# Patient Record
Sex: Male | Born: 1950 | Race: White | Hispanic: No | Marital: Married | Smoking: Former smoker
Health system: Southern US, Community
[De-identification: ages and names within clinical notes are randomized; demographics above are authoritative.]

## PROBLEM LIST (undated history)

## (undated) DIAGNOSIS — K219 Gastro-esophageal reflux disease without esophagitis: Secondary | ICD-10-CM

## (undated) DIAGNOSIS — I10 Essential (primary) hypertension: Secondary | ICD-10-CM

## (undated) DIAGNOSIS — Z800 Family history of malignant neoplasm of digestive organs: Secondary | ICD-10-CM

## (undated) DIAGNOSIS — E119 Type 2 diabetes mellitus without complications: Secondary | ICD-10-CM

## (undated) DIAGNOSIS — K222 Esophageal obstruction: Secondary | ICD-10-CM

## (undated) DIAGNOSIS — G8929 Other chronic pain: Secondary | ICD-10-CM

## (undated) DIAGNOSIS — N400 Enlarged prostate without lower urinary tract symptoms: Secondary | ICD-10-CM

## (undated) DIAGNOSIS — Z974 Presence of external hearing-aid: Secondary | ICD-10-CM

## (undated) DIAGNOSIS — E7800 Pure hypercholesterolemia, unspecified: Secondary | ICD-10-CM

## (undated) HISTORY — PX: REPLACEMENT TOTAL KNEE: SUR1224

## (undated) HISTORY — PX: ESOPHAGOGASTRODUODENOSCOPY (EGD) WITH ESOPHAGEAL DILATION: SHX5812

## (undated) HISTORY — PX: CERVICAL FUSION: SHX112

## (undated) HISTORY — PX: COLONOSCOPY W/ BIOPSIES AND POLYPECTOMY: SHX1376

---

## 2005-03-08 ENCOUNTER — Ambulatory Visit: Payer: Self-pay | Admitting: Gastroenterology

## 2005-12-13 ENCOUNTER — Ambulatory Visit: Payer: Self-pay | Admitting: Ophthalmology

## 2006-02-13 ENCOUNTER — Other Ambulatory Visit: Payer: Self-pay

## 2006-02-13 ENCOUNTER — Emergency Department: Payer: Self-pay | Admitting: Emergency Medicine

## 2006-02-14 ENCOUNTER — Ambulatory Visit: Payer: Self-pay | Admitting: Internal Medicine

## 2006-03-02 ENCOUNTER — Ambulatory Visit: Payer: Self-pay | Admitting: Internal Medicine

## 2007-06-04 ENCOUNTER — Ambulatory Visit: Payer: Self-pay | Admitting: Gastroenterology

## 2007-08-20 ENCOUNTER — Ambulatory Visit: Payer: Self-pay | Admitting: Internal Medicine

## 2008-03-08 IMAGING — CT CT CERVICAL SPINE WITHOUT CONTRAST
1 series · 12 of 14 positions shown, 15 images · non-contrast
Comparison: none

REASON FOR EXAM: Fell from roof  /pain  rm 9
COMMENTS:

[Series 5: axial · axial · 0.20mm/px · z∈[-137,+6]mm · 12 of 87 slices shown, 15 images]
[im 7/87  soft-tissue]
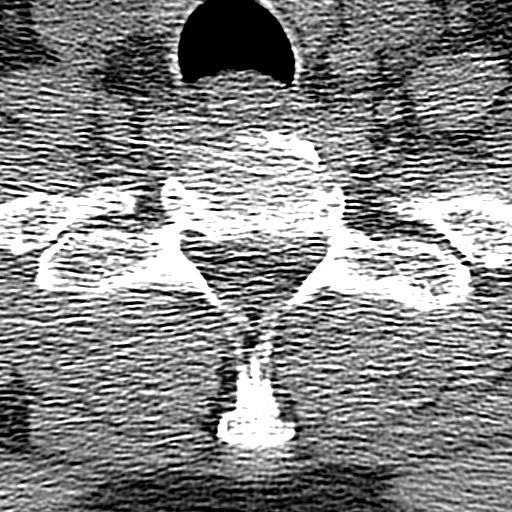
[im 7/87  bone]
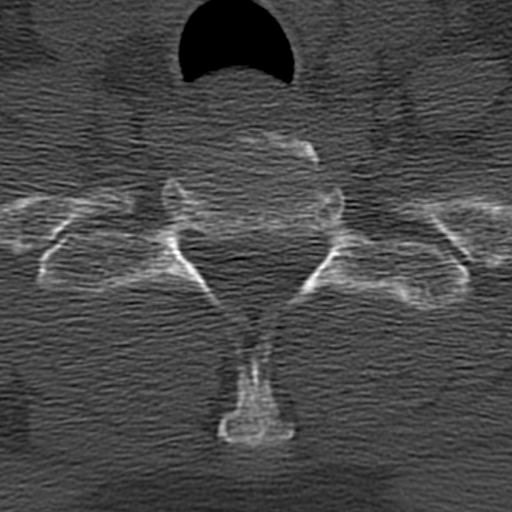
[im 14/87  bone]
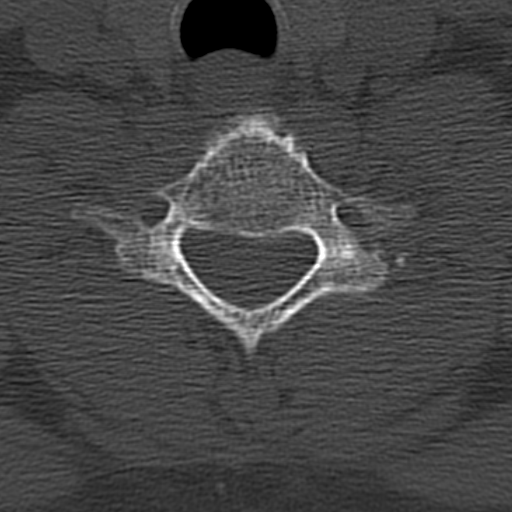
[im 20/87  bone]
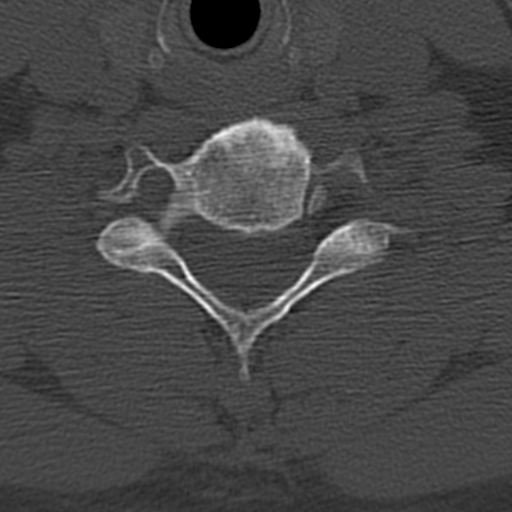
[im 27/87  bone]
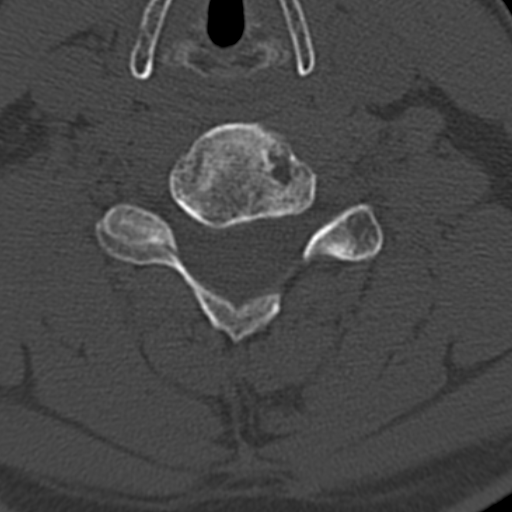
[im 34/87  soft-tissue]
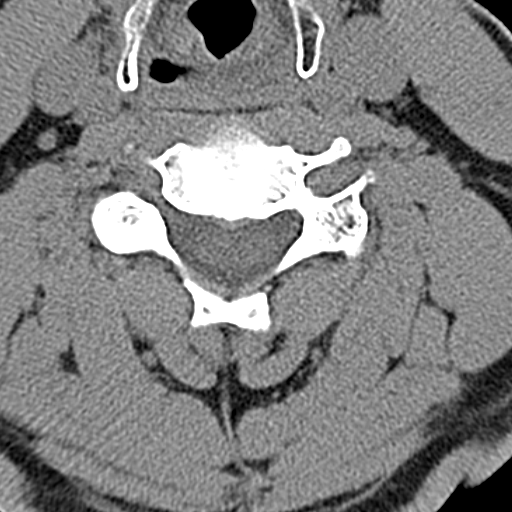
[im 34/87  bone]
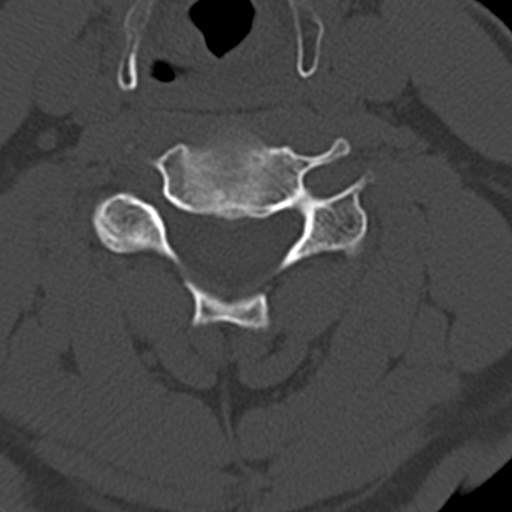
[im 40/87  bone]
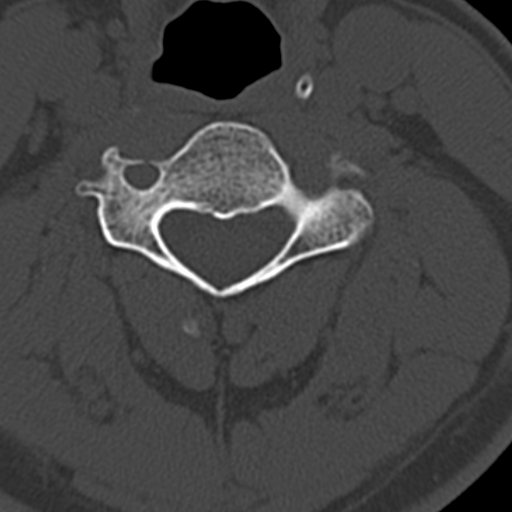
[im 47/87  bone]
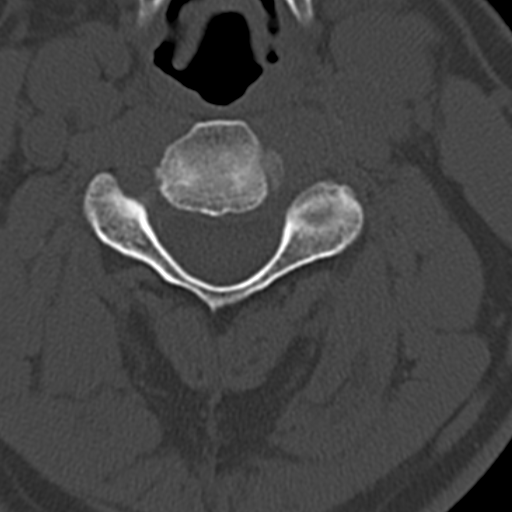
[im 53/87  bone]
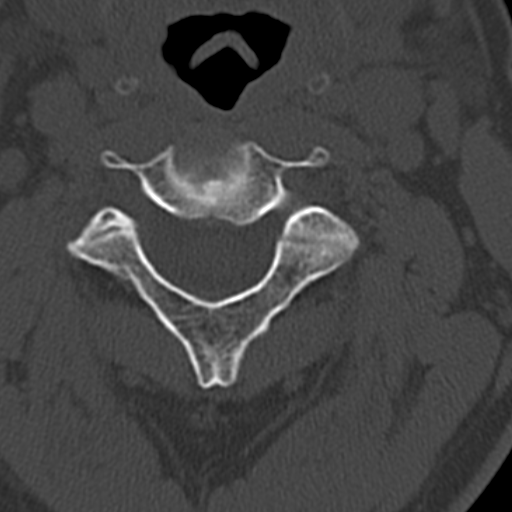
[im 60/87  soft-tissue]
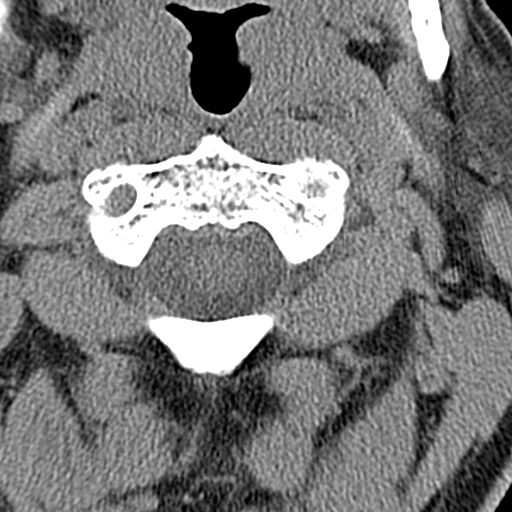
[im 60/87  bone]
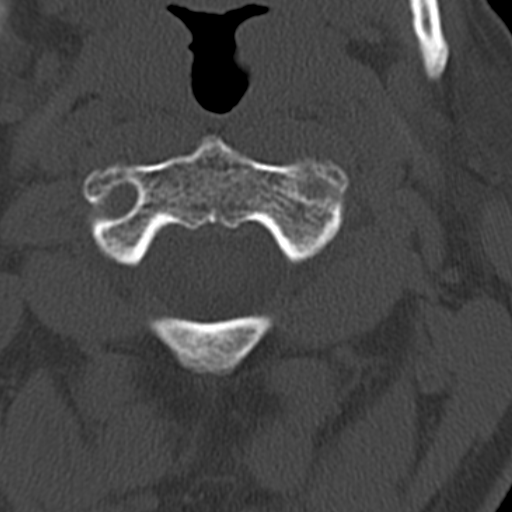
[im 67/87  bone]
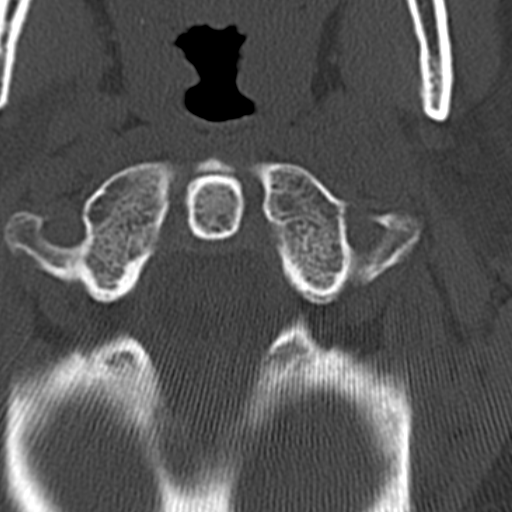
[im 73/87  bone]
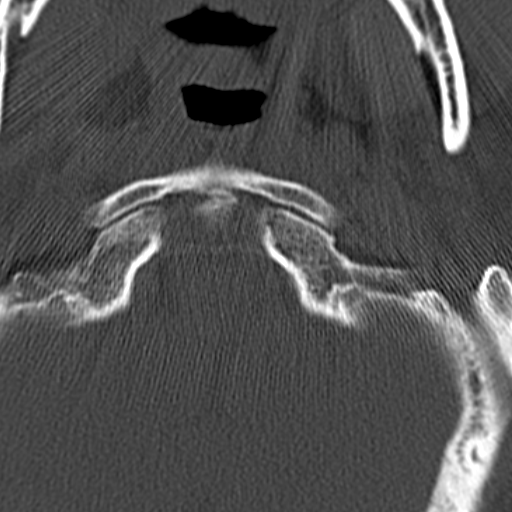
[im 80/87  bone]
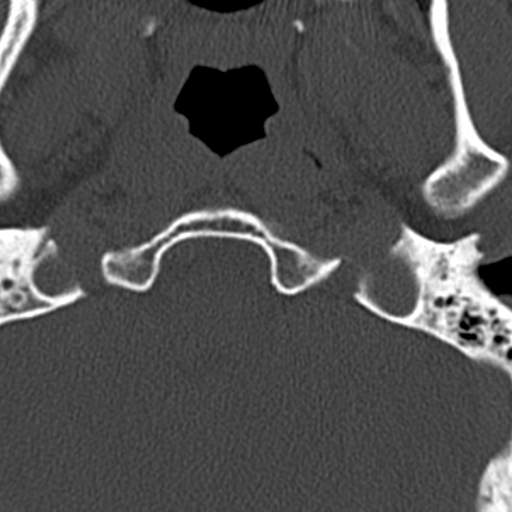

[12 of 14 positions shown; findings below may reference images not displayed]

PROCEDURE:     CT  - CT CERVICAL SPINE WO  - February 13, 2006  [DATE]

RESULT:     The patient sustained injury in a fall.

The cervical vertebral bodies are preserved in height.  There is congenital
fusion across the C5-C6 disc space. The prevertebral soft tissue spaces are
normal. The atlantodens interval is normal in appearance. The lateral masses
of C1 align normally with those of C2. The bony rings at each cervical level
are intact. There is no evidence of a jumped facet. No bony spinal stenosis
is evident.
IMPRESSION: 1. I do not see evidence of acute cervical spine fracture. There is no
evidence of dislocation or of a jumped facet either.
2. There is congenital fusion across the C5-C6 disc space. There is an
endplate spur centrally and to the right at this level but it does not
appear to the producing high-grade spinal stenosis.

The findings were called to the [HOSPITAL] the conclusion of
the study.

## 2008-03-08 IMAGING — CR DG LUMBAR SPINE AP/LAT/OBLIQUES W/ FLEX AND EXT
1 series · 5 of 5 positions shown · non-contrast
Comparison: none

REASON FOR EXAM: Fell from roof  /pain
COMMENTS:

[Series 1: view not recorded · 0.17mm/px · 5 of 5 slices shown]
[im 1/5]
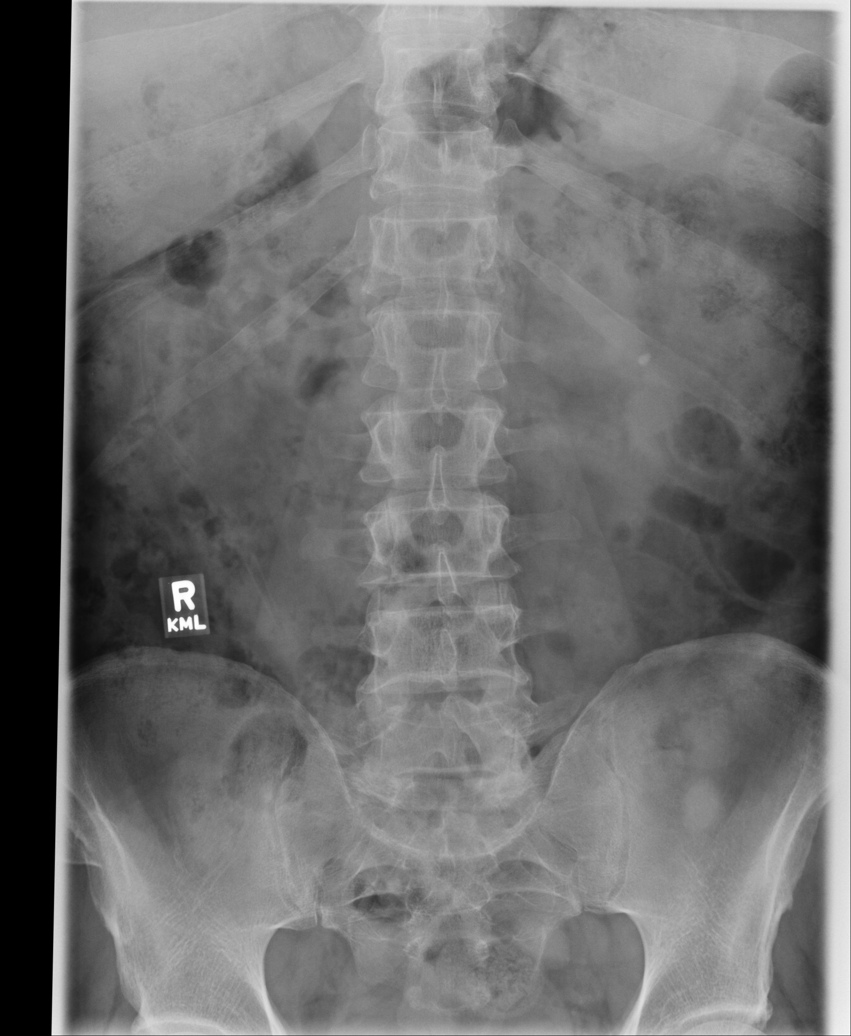
[im 2/5]
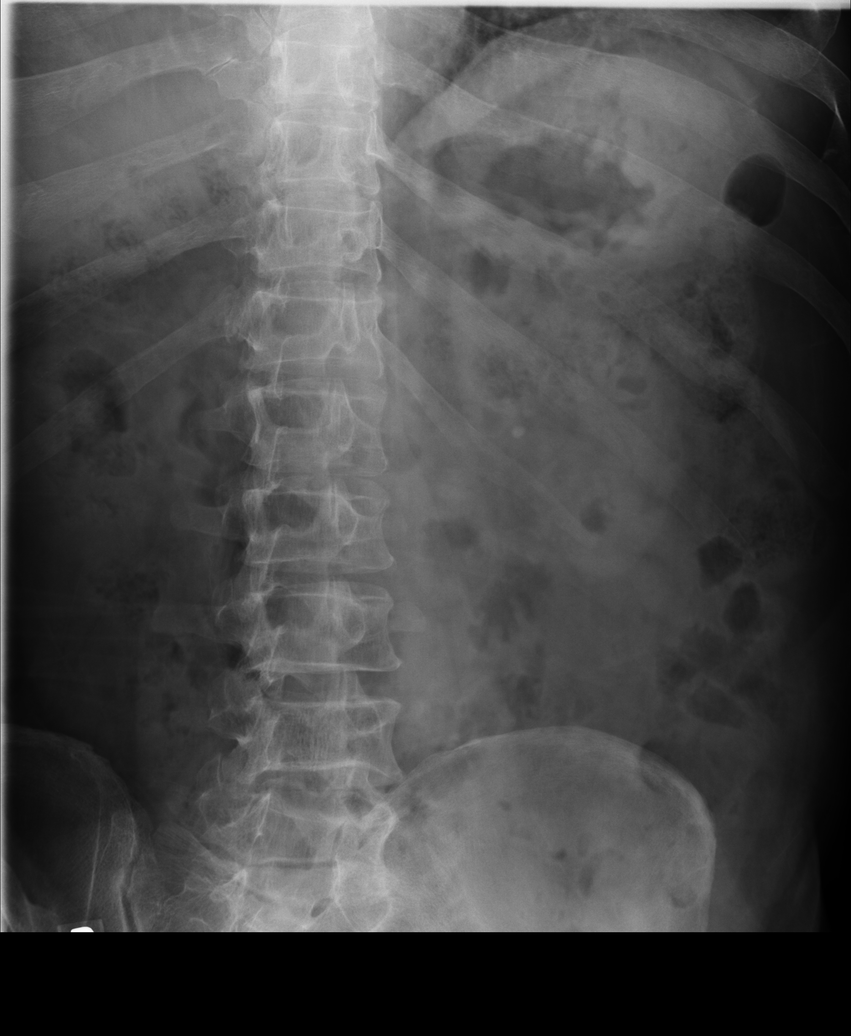
[im 3/5]
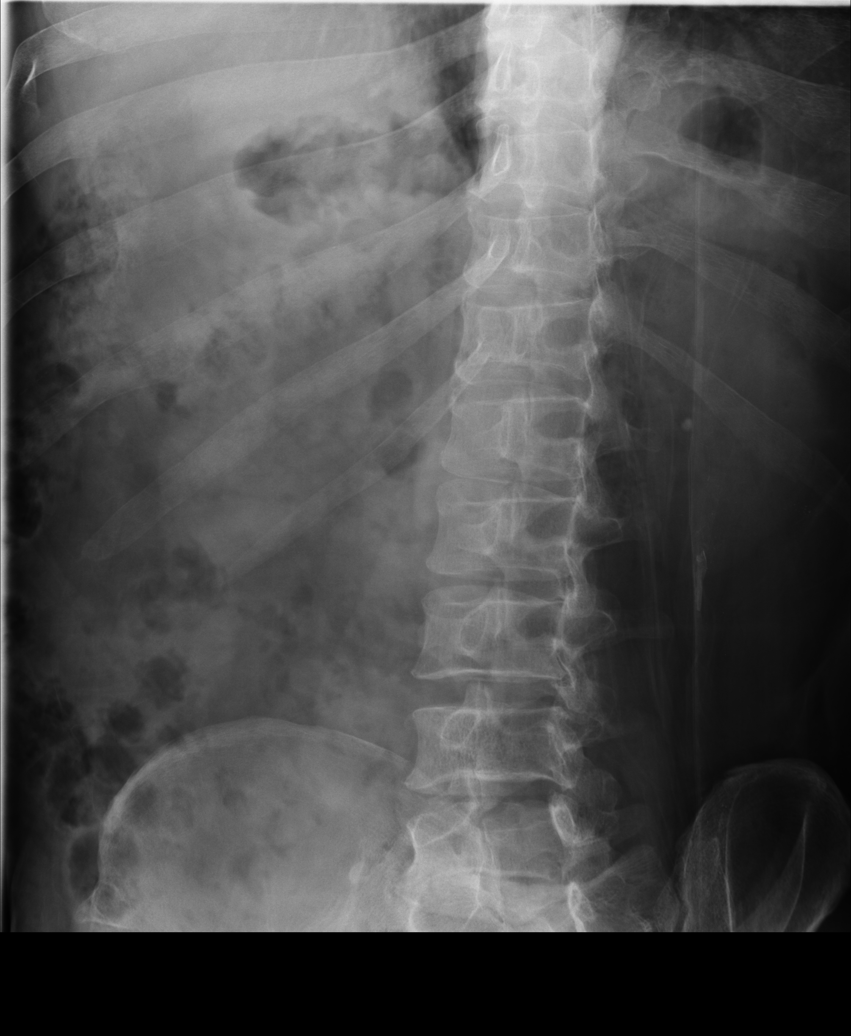
[im 4/5]
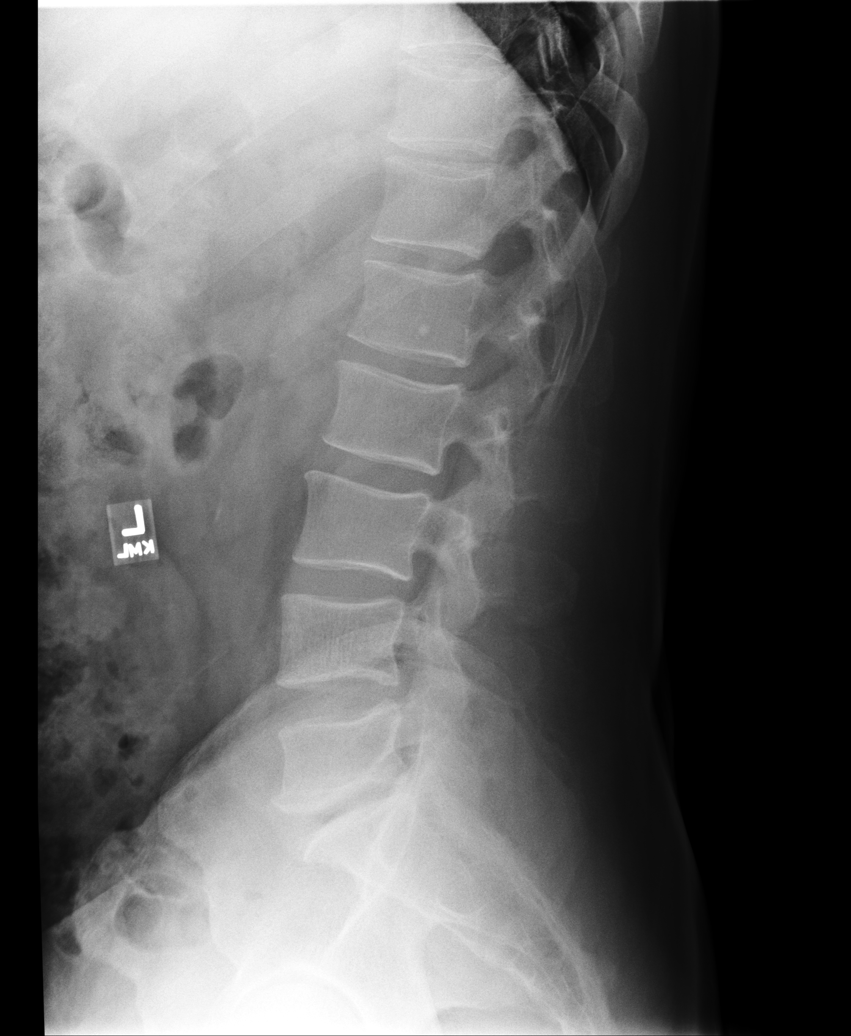
[im 5/5]
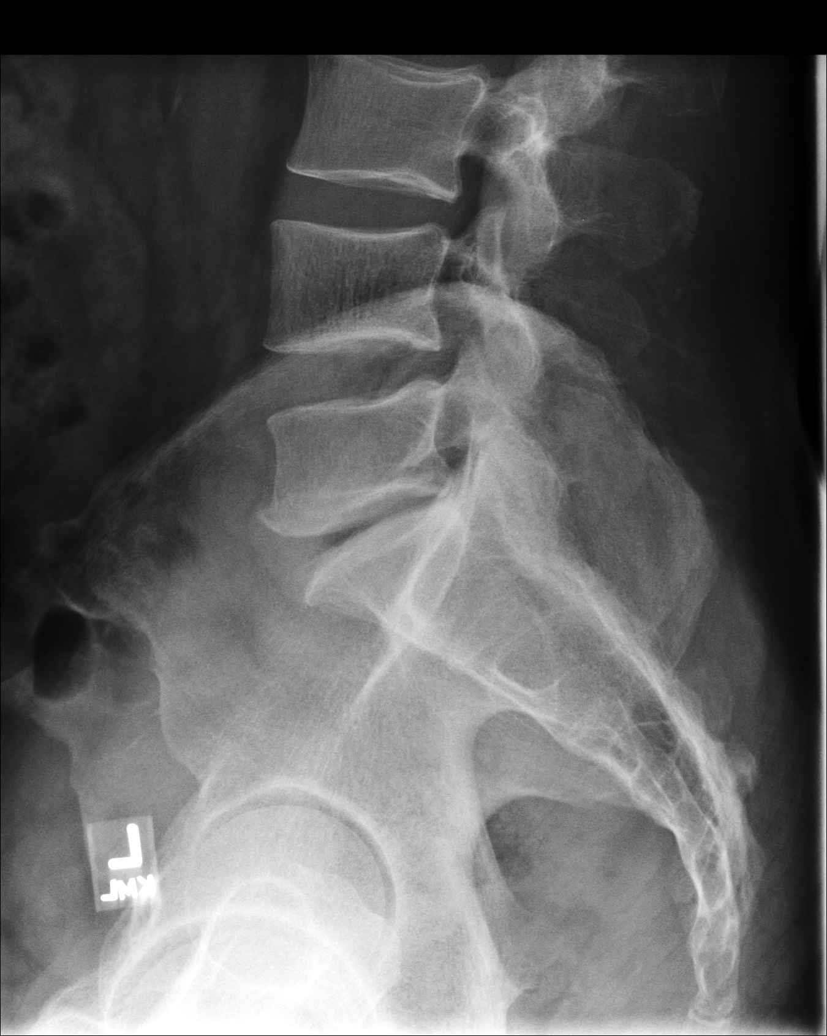

[5 of 5 positions shown; findings below may reference images not displayed]

PROCEDURE:     DXR - DXR LUMBAR SPINE WITH OBLIQUES  - February 13, 2006  [DATE]

RESULT:       Views of the lumbar spine show a rounded calcific density
superimposed over the T12, LEFT rib which may represent a renal stone.  The
vertebral body heights and intervertebral disc spaces appear to be within
normal limits.  The facets are normally aligned.  There appears to be a pars
defect on the RIGHT at L5.  There is disc space narrowing at L5-S1.  Pars
region on the LEFT at L5 appears to be intact.  Subtle defect may be
difficult to see.  CT could be performed for further evaluation if desired.
There is no subluxation.
IMPRESSION: 1.     Probable pars defect on the right at L5.
2.     Intervertebral disc space narrowing at L5-S1.

## 2008-03-08 IMAGING — CR DG CHEST 2V
1 series · 2 of 2 positions shown · non-contrast
Comparison: none

REASON FOR EXAM: fell  from roof  /pain
COMMENTS:

PROCEDURE:     DXR - DXR CHEST PA (OR AP) AND LATERAL  - February 13, 2006  [DATE]
RESULT:     The lungs are mildly hyperinflated but clear. The heart and
pulmonary vascularity are within the limits of normal. There is no pleural
effusion. I see no acute bony abnormality.

[Series 1: view not recorded · 0.17mm/px · 2 of 2 slices shown]
[im 1/2]
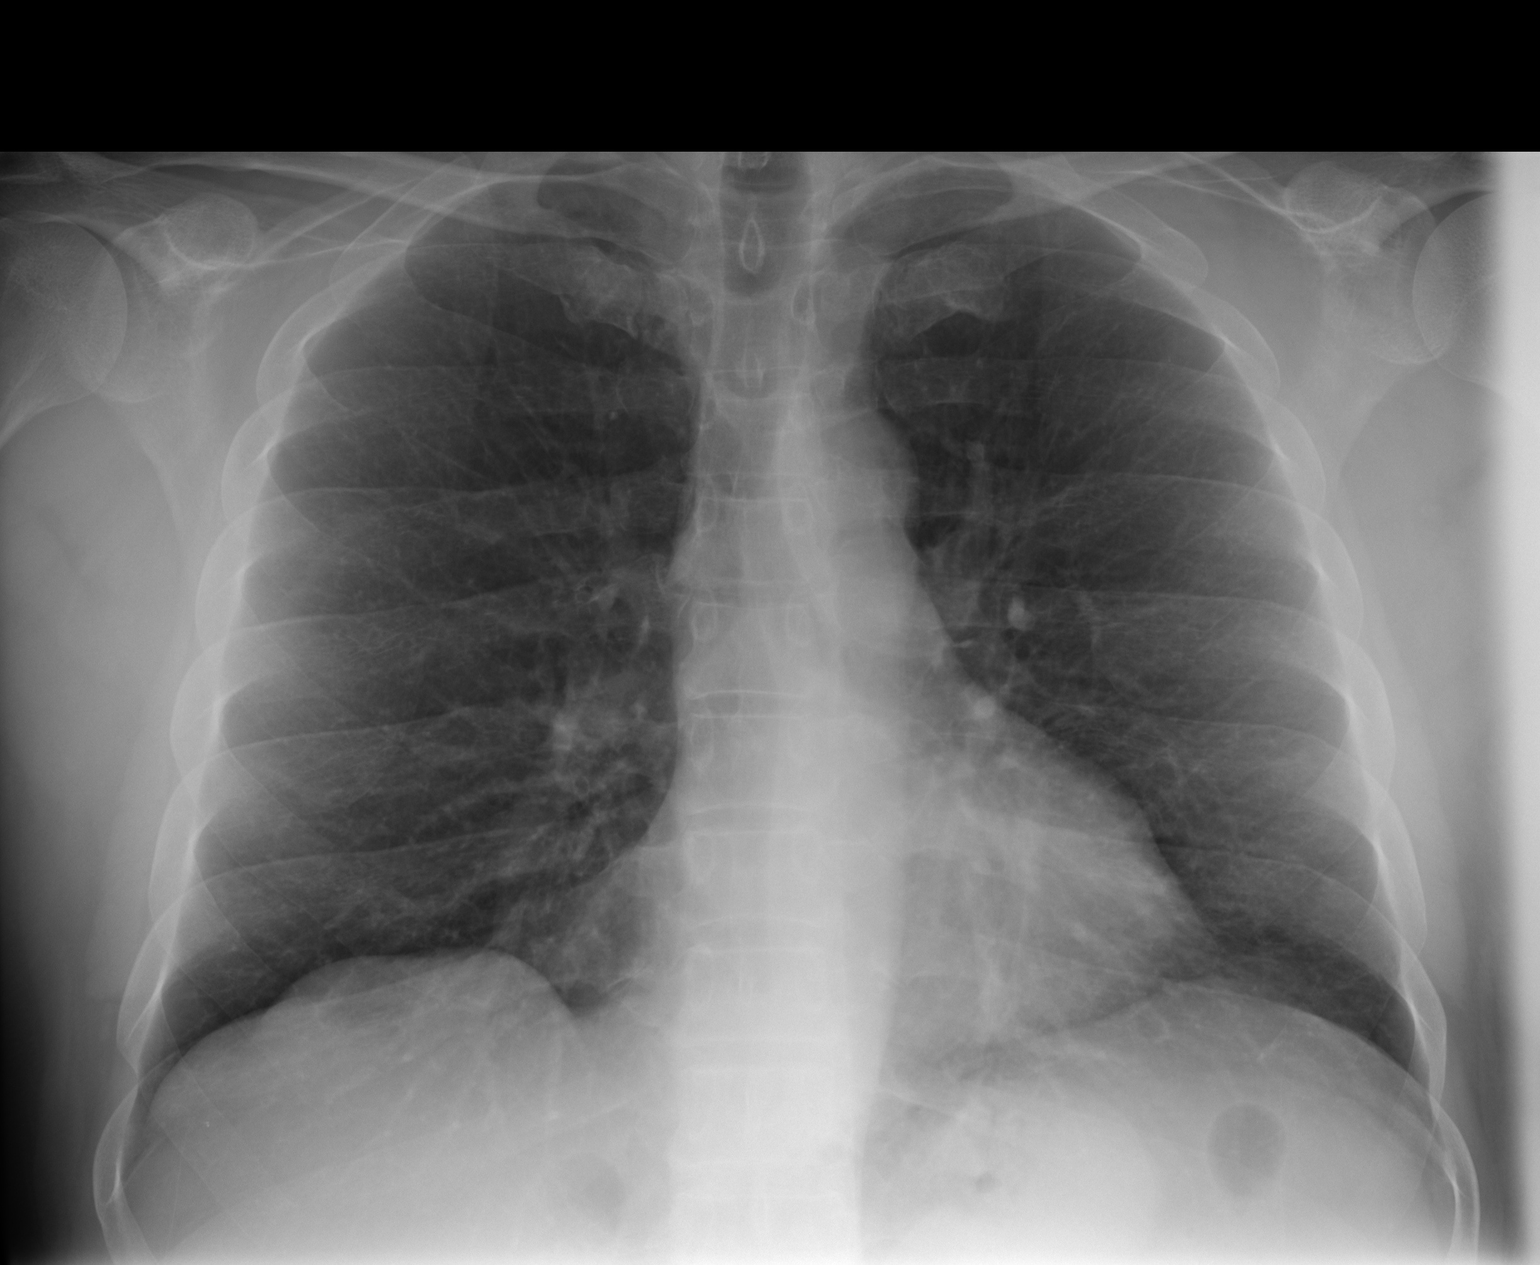
[im 2/2]
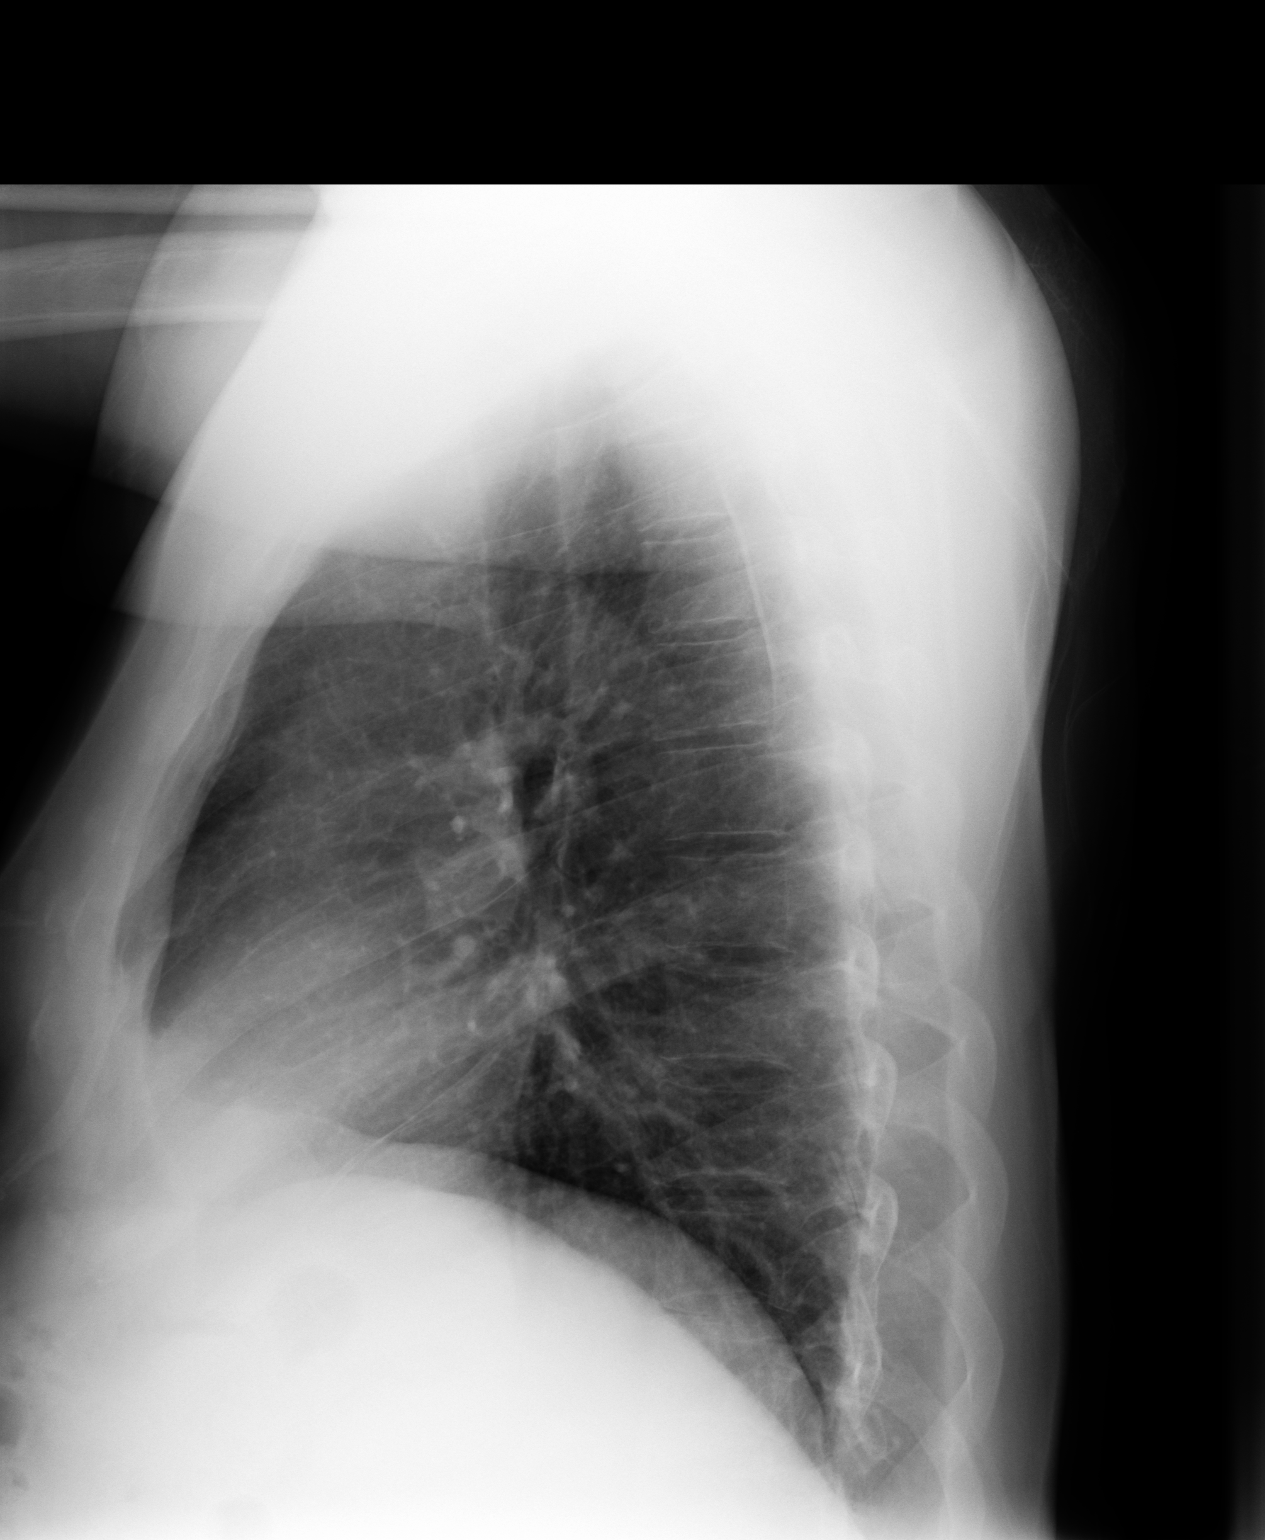

[2 of 2 positions shown; findings below may reference images not displayed]

IMPRESSION: I do not see evidence of a pneumothorax nor definite
evidence of acute rib fracture. If there remain clinical concerns of
thoracic trauma, further evaluation with CT scanning is available for
request.

## 2008-03-09 IMAGING — CR DG ABDOMEN 1V
1 series · 1 of 1 positions shown · non-contrast
Comparison: none

REASON FOR EXAM: Left flank pain
COMMENTS:

[view not recorded]
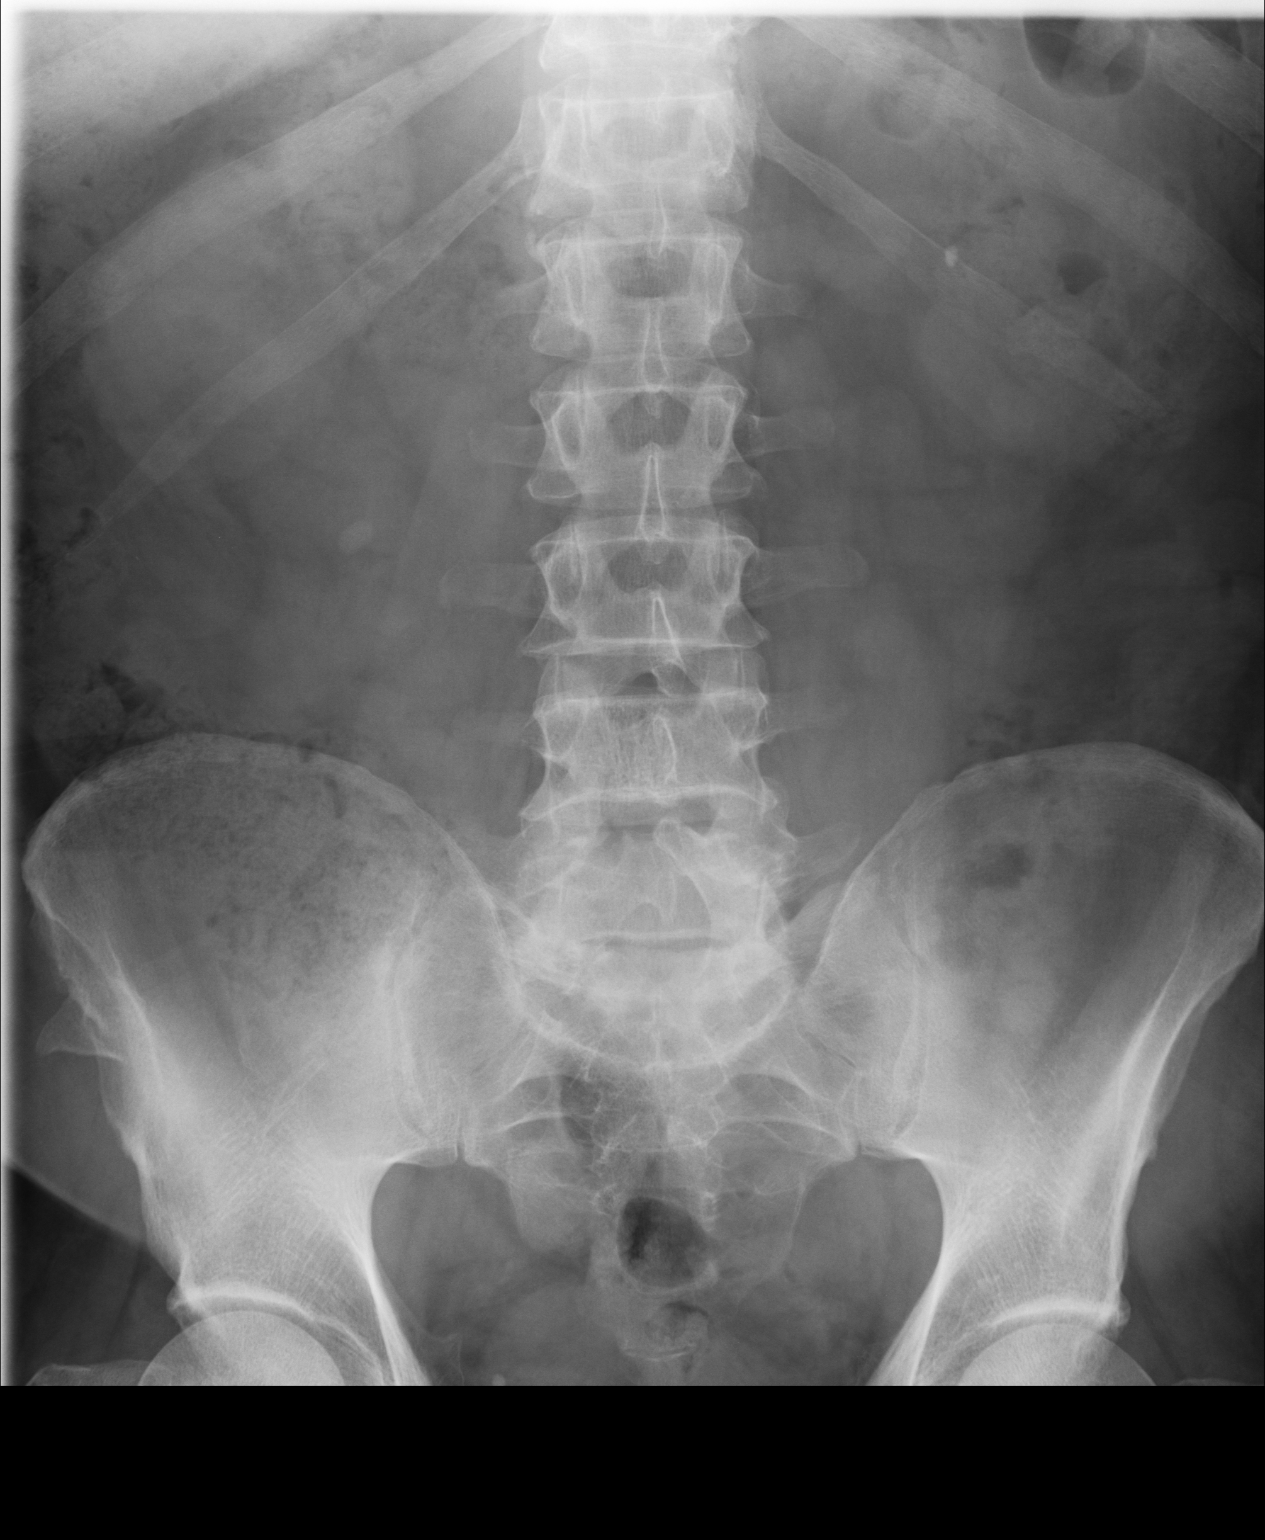

[1 of 1 positions shown; findings below may reference images not displayed]

PROCEDURE:     DXR - DXR KIDNEY URETER BLADDER  - February 14, 2006 [DATE]

RESULT:

The bowel gas pattern is unremarkable. There is a rounded calcification
projecting over the renal pelvis on the left which measures approximately 6
mm in greatest dimension. There is also a faint calcification that projects
inferior to the lower pole of the right kidney which measures as much is 11
mm in greatest dimension. There is a probable phlebolith in the right aspect
and pelvis. I see no acute bony abnormality.
IMPRESSION: 1. There is a calcification that projects over the region of the renal
pelvis on the left that likely reflects a stone.
2. I cannot exclude a stone in the region the proximal right ureter.
Further value evaluation with CT scanning or an IVP may be of value.
3. I see no bowel abnormality.

## 2010-04-04 ENCOUNTER — Ambulatory Visit: Payer: Self-pay | Admitting: Specialist

## 2010-04-15 ENCOUNTER — Ambulatory Visit: Payer: Self-pay | Admitting: Specialist

## 2010-04-22 ENCOUNTER — Ambulatory Visit: Payer: Self-pay | Admitting: Specialist

## 2010-10-24 ENCOUNTER — Ambulatory Visit: Payer: Self-pay | Admitting: Specialist

## 2010-11-01 ENCOUNTER — Inpatient Hospital Stay: Payer: Self-pay | Admitting: Specialist

## 2014-01-07 ENCOUNTER — Ambulatory Visit: Payer: Self-pay | Admitting: Gastroenterology

## 2014-04-27 LAB — SURGICAL PATHOLOGY

## 2016-09-27 ENCOUNTER — Encounter: Payer: Self-pay | Admitting: Emergency Medicine

## 2016-09-27 ENCOUNTER — Emergency Department
Admission: EM | Admit: 2016-09-27 | Discharge: 2016-09-27 | Disposition: A | Discharge status: Home / Self Care | Payer: No Typology Code available for payment source | Attending: Emergency Medicine | Admitting: Emergency Medicine

## 2016-09-27 ENCOUNTER — Emergency Department: Payer: No Typology Code available for payment source

## 2016-09-27 DIAGNOSIS — Y9389 Activity, other specified: Secondary | ICD-10-CM | POA: Diagnosis not present

## 2016-09-27 DIAGNOSIS — I10 Essential (primary) hypertension: Secondary | ICD-10-CM | POA: Insufficient documentation

## 2016-09-27 DIAGNOSIS — S161XXA Strain of muscle, fascia and tendon at neck level, initial encounter: Secondary | ICD-10-CM | POA: Diagnosis not present

## 2016-09-27 DIAGNOSIS — Z96651 Presence of right artificial knee joint: Secondary | ICD-10-CM | POA: Diagnosis not present

## 2016-09-27 DIAGNOSIS — Y92410 Unspecified street and highway as the place of occurrence of the external cause: Secondary | ICD-10-CM | POA: Diagnosis not present

## 2016-09-27 DIAGNOSIS — Y999 Unspecified external cause status: Secondary | ICD-10-CM | POA: Insufficient documentation

## 2016-09-27 DIAGNOSIS — Z87891 Personal history of nicotine dependence: Secondary | ICD-10-CM | POA: Insufficient documentation

## 2016-09-27 DIAGNOSIS — S199XXA Unspecified injury of neck, initial encounter: Secondary | ICD-10-CM | POA: Diagnosis present

## 2016-09-27 HISTORY — DX: Pure hypercholesterolemia, unspecified: E78.00

## 2016-09-27 HISTORY — DX: Essential (primary) hypertension: I10

## 2016-09-27 MED ORDER — ORPHENADRINE CITRATE 30 MG/ML IJ SOLN
60.0000 mg | Freq: Once | INTRAMUSCULAR | Status: AC
  Administered 2016-09-27: 60 mg via INTRAMUSCULAR
  Filled 2016-09-27: qty 2

## 2016-09-27 MED ORDER — KETOROLAC TROMETHAMINE 30 MG/ML IJ SOLN
30.0000 mg | Freq: Once | INTRAMUSCULAR | Status: AC
  Administered 2016-09-27: 30 mg via INTRAMUSCULAR
  Filled 2016-09-27: qty 1

## 2016-09-27 MED ORDER — METHOCARBAMOL 750 MG PO TABS: 750.0000 mg | ORAL_TABLET | Freq: Four times a day (QID) | ORAL | 0 refills | Status: DC

## 2016-09-27 MED ORDER — MELOXICAM 15 MG PO TABS: 15.0000 mg | ORAL_TABLET | Freq: Every day | ORAL | 0 refills | Status: DC
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
## 2021-10-13 ENCOUNTER — Other Ambulatory Visit: Payer: Self-pay

## 2021-10-13 MED ORDER — CIPROFLOXACIN-DEXAMETHASONE 0.3-0.1 % OT SUSP
OTIC | 0 refills | Status: DC
  Filled 2021-10-13: qty 7.5, 10d supply, fill #0

## 2021-10-14 ENCOUNTER — Other Ambulatory Visit: Payer: Self-pay

## 2021-10-14 ENCOUNTER — Encounter: Payer: Self-pay | Admitting: Unknown Physician Specialty

## 2021-10-17 ENCOUNTER — Encounter: Payer: Self-pay | Admitting: Unknown Physician Specialty

## 2021-10-17 ENCOUNTER — Other Ambulatory Visit: Payer: Self-pay

## 2021-10-17 NOTE — Discharge Instructions (Signed)
[facility] DISCHARGE INSTRUCTIONS FOR MYRINGOTOMY AND TUBE INSERTION  [facility] CHAPMAN T. MCQUEEN, M.D.   Diet:   After surgery, the patient should take only liquids and foods as tolerated.  The patient may then have a regular diet after the effects of anesthesia have worn off, usually about four to six hours after surgery.  Activities:   The patient should rest until the effects of anesthesia have worn off.  After this, there are no restrictions on the normal daily activities.  Medications:   You will be given a prescription for antibiotic drops to be used in the ears postoperatively.  It is recommended to use 4 drops 2 times a day for 7 days, then the drops should be saved for possible future use.  The tubes should not cause any discomfort to the patient, but if there is any question, Tylenol should be given according to the instructions for the age of the patient.  Other medications should be continued normally.  Precautions:   Should there be recurrent drainage after the tubes are placed, the drops should be used for approximately 3-4 days.  If it does not clear, you should call the ENT office.  Earplugs:   Earplugs are only needed for those who are going to be submerged under water.  When taking a bath or shower and using a cup or showerhead to rinse hair, it is not necessary to wear earplugs.  These come in a variety of fashions, all of which can be obtained at our office.  However, if one is not able to come by the office, then silicone plugs can be found at most pharmacies.  It is not advised to stick anything in the ear that is not approved as an earplug.  Silly putty is not to be used as an earplug.  Swimming is allowed in patients after ear tubes are inserted, however, they must wear earplugs if they are going to be submerged under water.  For those children who are going to be swimming a lot, it is recommended to use a fitted ear mold, which can be  made by our audiologist.  If discharge is noticed from the ears, this most likely represents an ear infection.  We would recommend getting your eardrops and using them as indicated above.  If it does not clear, then you should call the ENT office.  For follow up, the patient should return to the ENT office three weeks postoperatively and then every six months as required by the doctor. 

## 2021-10-20 NOTE — Anesthesia Preprocedure Evaluation (Addendum)
Anesthesia Evaluation  Patient identified by MRN, date of birth, ID band Patient awake    Reviewed: Allergy & Precautions, NPO status , Patient's Chart, lab work & pertinent test results  Airway Mallampati: III  TM Distance: >3 FB Neck ROM: full    Dental  (+) Chipped   Pulmonary neg pulmonary ROS, former smoker,    Pulmonary exam normal        Cardiovascular hypertension, Normal cardiovascular exam     Neuro/Psych negative neurological ROS  negative psych ROS   GI/Hepatic Neg liver ROS, GERD  Medicated and Controlled,  Endo/Other  diabetes  Renal/GU negative Renal ROS  negative genitourinary   Musculoskeletal   Abdominal Normal abdominal exam  (+)   Peds  Hematology negative hematology ROS (+)   Anesthesia Other Findings Past Medical History: No date: BPH (benign prostatic hyperplasia) No date: Chronic pain of right knee No date: DM II (diabetes mellitus, type II), controlled ([place]) No date: Esophageal stricture No date: Family history of colon cancer     Comment:  sister (in her 15s) No date: GERD (gastroesophageal reflux disease) No date: Hypercholesteremia No date: Hypertension No date: Wears hearing aid in both ears  Past Surgical History: No date: CERVICAL FUSION     Comment:  c5-c6 No date: COLONOSCOPY W/ BIOPSIES AND POLYPECTOMY No date: ESOPHAGOGASTRODUODENOSCOPY (EGD) WITH ESOPHAGEAL DILATION No date: REPLACEMENT TOTAL KNEE; Right No date: REPLACEMENT TOTAL KNEE; Right  BMI    Body Mass Index: 30.13 kg/m      Reproductive/Obstetrics negative OB ROS                            Anesthesia Physical Anesthesia Plan  ASA: 2  Anesthesia Plan: General   Post-op Pain Management: Minimal or no pain anticipated   Induction: Intravenous  PONV Risk Score and Plan: 2 and Ondansetron and Treatment may vary due to age or medical condition  Airway Management Planned:  Natural Airway  Additional Equipment:   Intra-op Plan:   Post-operative Plan:   Informed Consent: I have reviewed the patients History and Physical, chart, labs and discussed the procedure including the risks, benefits and alternatives for the proposed anesthesia with the patient or authorized representative who has indicated his/her understanding and acceptance.     Dental Advisory Given  Plan Discussed with: Anesthesiologist, CRNA and Surgeon  Anesthesia Plan Comments:        Anesthesia Quick Evaluation

## 2021-10-21 ENCOUNTER — Ambulatory Visit
Admission: RE | Admit: 2021-10-21 | Discharge: 2021-10-21 | Disposition: A | Discharge status: Home / Self Care | Payer: Medicare HMO | Attending: Unknown Physician Specialty | Admitting: Unknown Physician Specialty

## 2021-10-21 ENCOUNTER — Ambulatory Visit
Admission: RE | Disposition: A | Discharge status: Home / Self Care | Payer: Self-pay | Source: Home / Self Care | Attending: Unknown Physician Specialty

## 2021-10-21 ENCOUNTER — Ambulatory Visit: Payer: Medicare HMO | Admitting: Anesthesiology

## 2021-10-21 ENCOUNTER — Other Ambulatory Visit: Payer: Self-pay

## 2021-10-21 ENCOUNTER — Encounter: Payer: Self-pay | Admitting: Unknown Physician Specialty

## 2021-10-21 DIAGNOSIS — K219 Gastro-esophageal reflux disease without esophagitis: Secondary | ICD-10-CM | POA: Diagnosis not present

## 2021-10-21 DIAGNOSIS — I10 Essential (primary) hypertension: Secondary | ICD-10-CM | POA: Insufficient documentation

## 2021-10-21 DIAGNOSIS — Z87891 Personal history of nicotine dependence: Secondary | ICD-10-CM | POA: Insufficient documentation

## 2021-10-21 DIAGNOSIS — Z7984 Long term (current) use of oral hypoglycemic drugs: Secondary | ICD-10-CM | POA: Diagnosis not present

## 2021-10-21 DIAGNOSIS — H9072 Mixed conductive and sensorineural hearing loss, unilateral, left ear, with unrestricted hearing on the contralateral side: Secondary | ICD-10-CM | POA: Diagnosis not present

## 2021-10-21 DIAGNOSIS — Z79899 Other long term (current) drug therapy: Secondary | ICD-10-CM | POA: Diagnosis not present

## 2021-10-21 DIAGNOSIS — H6522 Chronic serous otitis media, left ear: Secondary | ICD-10-CM | POA: Insufficient documentation

## 2021-10-21 DIAGNOSIS — E119 Type 2 diabetes mellitus without complications: Secondary | ICD-10-CM | POA: Insufficient documentation

## 2021-10-21 DIAGNOSIS — H6992 Unspecified Eustachian tube disorder, left ear: Secondary | ICD-10-CM | POA: Diagnosis present

## 2021-10-21 HISTORY — DX: Presence of external hearing-aid: Z97.4

## 2021-10-21 HISTORY — DX: Family history of malignant neoplasm of digestive organs: Z80.0

## 2021-10-21 HISTORY — DX: Gastro-esophageal reflux disease without esophagitis: K21.9

## 2021-10-21 HISTORY — DX: Type 2 diabetes mellitus without complications: E11.9

## 2021-10-21 HISTORY — DX: Other chronic pain: G89.29

## 2021-10-21 HISTORY — DX: Benign prostatic hyperplasia without lower urinary tract symptoms: N40.0

## 2021-10-21 HISTORY — DX: Esophageal obstruction: K22.2

## 2021-10-21 HISTORY — PX: MYRINGOTOMY WITH TUBE PLACEMENT: SHX5663

## 2021-10-21 LAB — GLUCOSE, CAPILLARY
Glucose-Capillary: 136 mg/dL — ABNORMAL HIGH (ref 70–99)
Glucose-Capillary: 130 mg/dL — ABNORMAL HIGH (ref 70–99)

## 2021-10-21 SURGERY — MYRINGOTOMY WITH TUBE PLACEMENT
Anesthesia: General | Site: Ear | Laterality: Left

## 2021-10-21 MED ORDER — PROPOFOL 500 MG/50ML IV EMUL
INTRAVENOUS | Status: DC | PRN
  Administered 2021-10-21: 140 ug/kg/min via INTRAVENOUS

## 2021-10-21 MED ORDER — LACTATED RINGERS IV SOLN: INTRAVENOUS | Status: DC

## 2021-10-21 MED ORDER — CIPROFLOXACIN-DEXAMETHASONE 0.3-0.1 % OT SUSP
OTIC | Status: DC | PRN
  Administered 2021-10-21: 1 [drp] via OTIC

## 2021-10-21 SURGICAL SUPPLY — 10 items
BALL CTTN LRG ABS STRL LF (GAUZE/BANDAGES/DRESSINGS) ×1
BLADE MYR LANCE NRW W/HDL (BLADE) IMPLANT
CANISTER SUCT 1200ML W/VALVE (MISCELLANEOUS) ×1 IMPLANT
COTTONBALL LRG STERILE PKG (GAUZE/BANDAGES/DRESSINGS) ×1 IMPLANT
GLOVE SURG ENC TEXT LTX SZ7.5 (GLOVE) ×1 IMPLANT
STRAP BODY AND KNEE 60X3 (MISCELLANEOUS) ×1 IMPLANT
TOWEL OR 17X26 4PK STRL BLUE (TOWEL DISPOSABLE) ×1 IMPLANT
TUBE T RICHARD MOD 1.32 ID 4.8 (OTOLOGIC RELATED) IMPLANT
TUBING CONN 6MMX3.1M (TUBING) ×1
TUBING SUCTION CONN 0.25 STRL (TUBING) ×1 IMPLANT

## 2021-10-21 NOTE — H&P (Signed)
The patient's history has been reviewed, patient examined, no change in status, stable for surgery.  Questions were answered to the patients satisfaction.  

## 2021-10-21 NOTE — Op Note (Signed)
10/21/2021  8:32 AM    Ronald Aguilar  [id number]   Pre-Op Dx: Ronald Aguilar Chronic Otitis Media  Post-op Dx: SAME  Proc: Left myringotomy and butterfly tube placement  Surg:  Ronald Aguilar  Anes:  GOT  EBL: 0  Comp: None  Findings: Serous otitis left middle ear  Procedure: Ronald Aguilar was identified in the holding area taken the operating placed in supine position.  After IV sedation the operating microscope was brought in the field.  Lamination the left ear showed left middle ear effusion.  An inferior myringotomy was performed serous fluid was suctioned free.  A butterfly T-tube was placed within the myringotomy followed by Ciprodex.  Patient was then awakened in the operating room taken to cover in stable condition  Dispo:   Good  Plan: Discharged home follow-up 3 weeks  Ronald Aguilar  10/21/2021 8:32 AM

## 2021-10-21 NOTE — Anesthesia Postprocedure Evaluation (Signed)
Anesthesia Post Note  Patient: Ronald Aguilar.  Procedure(s) Performed: MYRINGOTOMY WITH TUBE PLACEMENT WITH BUTTERFLY TUBE (Left: Ear)  Patient location during evaluation: PACU Anesthesia Type: General Level of consciousness: awake and alert Pain management: pain level controlled Vital Signs Assessment: post-procedure vital signs reviewed and stable Respiratory status: spontaneous breathing, nonlabored ventilation and respiratory function stable Cardiovascular status: blood pressure returned to baseline and stable Postop Assessment: no apparent nausea or vomiting Anesthetic complications: no   No notable events documented.   Last Vitals:  Vitals:   10/21/21 0838 10/21/21 0849  BP: 126/72 134/81  Pulse: 72   Resp: (!) 21   Temp: (!) 36.3 C (!) 36.4 C  SpO2: 97%     Last Pain:  Vitals:   10/21/21 0849  TempSrc:   PainSc: [place]

## 2021-10-21 NOTE — Transfer of Care (Signed)
Immediate Anesthesia Transfer of Care Note  Patient: Ronald Aguilar.  Procedure(s) Performed: MYRINGOTOMY WITH TUBE PLACEMENT WITH BUTTERFLY TUBE (Left: Ear)  Patient Location: PACU  Anesthesia Type:General  Level of Consciousness: awake, alert  and oriented  Airway & Oxygen Therapy: Patient Spontanous Breathing and Patient connected to nasal cannula oxygen  Post-op Assessment: Report given to RN and Post -op Vital signs reviewed and stable  Post vital signs: stable  Last Vitals:  Vitals Value Taken Time  BP    Temp    Pulse 72 10/21/21 0838  Resp 21 10/21/21 0838  SpO2 97 % 10/21/21 0838  Vitals shown include unvalidated device data.  Last Pain:  Vitals:   10/21/21 0743  TempSrc: Temporal  PainSc: 0-No pain         Complications: No notable events documented.

## 2021-10-23 ENCOUNTER — Other Ambulatory Visit: Payer: Self-pay

## 2021-10-24 ENCOUNTER — Encounter: Payer: Self-pay | Admitting: Unknown Physician Specialty

## 2021-12-12 ENCOUNTER — Other Ambulatory Visit: Payer: Self-pay

## 2021-12-12 ENCOUNTER — Emergency Department
Admission: EM | Admit: 2021-12-12 | Discharge: 2021-12-13 | Discharge status: Against Medical Advice | Payer: Medicare HMO | Attending: Emergency Medicine | Admitting: Emergency Medicine

## 2021-12-12 ENCOUNTER — Emergency Department: Payer: Medicare HMO

## 2021-12-12 DIAGNOSIS — Z5321 Procedure and treatment not carried out due to patient leaving prior to being seen by health care provider: Secondary | ICD-10-CM | POA: Diagnosis not present

## 2021-12-12 DIAGNOSIS — R410 Disorientation, unspecified: Secondary | ICD-10-CM | POA: Diagnosis not present

## 2021-12-12 LAB — DIFFERENTIAL
Abs Immature Granulocytes: 0.03 10*3/uL (ref 0.00–0.07)
Basophils Absolute: 0.1 10*3/uL (ref 0.0–0.1)
Basophils Relative: 2 %
Eosinophils Absolute: 0.3 10*3/uL (ref 0.0–0.5)
Eosinophils Relative: 4 %
Immature Granulocytes: 0 %
Lymphocytes Relative: 29 %
Lymphs Abs: 2.2 10*3/uL (ref 0.7–4.0)
Monocytes Absolute: 0.7 10*3/uL (ref 0.1–1.0)
Monocytes Relative: 9 %
Neutro Abs: 4.2 10*3/uL (ref 1.7–7.7)
Neutrophils Relative %: 56 %

## 2021-12-12 LAB — COMPREHENSIVE METABOLIC PANEL
ALT: 35 U/L (ref 0–44)
AST: 34 U/L (ref 15–41)
Albumin: 4.2 g/dL (ref 3.5–5.0)
Alkaline Phosphatase: 49 U/L (ref 38–126)
Anion gap: 8 (ref 5–15)
BUN: 16 mg/dL (ref 8–23)
CO2: 26 mmol/L (ref 22–32)
Calcium: 9.8 mg/dL (ref 8.9–10.3)
Chloride: 107 mmol/L (ref 98–111)
Creatinine, Ser: 0.87 mg/dL (ref 0.61–1.24)
GFR, Estimated: >60 mL/min (ref >60)
Glucose, Bld: 166 mg/dL — ABNORMAL HIGH (ref 70–99)
Potassium: 4.1 mmol/L (ref 3.5–5.1)
Sodium: 141 mmol/L (ref 135–145)
Total Bilirubin: 1.1 mg/dL (ref 0.3–1.2)
Total Protein: 7.4 g/dL (ref 6.5–8.1)

## 2021-12-12 LAB — CBC
HCT: 41.6 % (ref 39.0–52.0)
Hemoglobin: 13.5 g/dL (ref 13.0–17.0)
MCH: 28.5 pg (ref 26.0–34.0)
MCHC: 32.5 g/dL (ref 30.0–36.0)
MCV: 87.8 fL (ref 80.0–100.0)
Platelets: 245 10*3/uL (ref 150–400)
RBC: 4.74 MIL/uL (ref 4.22–5.81)
RDW: 12.9 % (ref 11.5–15.5)
WBC: 7.5 10*3/uL (ref 4.0–10.5)
nRBC: 0 % (ref 0.0–0.2)

## 2021-12-12 LAB — TROPONIN I (HIGH SENSITIVITY)
Troponin I (High Sensitivity): 4 ng/L (ref <18)
Troponin I (High Sensitivity): 4 ng/L (ref <18)

## 2021-12-12 LAB — APTT: aPTT: 27 seconds (ref 24–36)

## 2021-12-12 LAB — ETHANOL: Alcohol, Ethyl (B): <10 mg/dL (ref <10)

## 2021-12-12 LAB — PROTIME-INR
INR: 1 (ref 0.8–1.2)
Prothrombin Time: 13.1 seconds (ref 11.4–15.2)

## 2021-12-12 MED ORDER — SODIUM CHLORIDE 0.9% FLUSH: 3.0000 mL | Freq: Once | INTRAVENOUS | Status: DC

## 2021-12-12 NOTE — ED Triage Notes (Signed)
Pt to ED via POV. Pt states that today around 1130 he went to speak with his wife and he was not able to complete a sentence. Pt states that he has been very confused as well. Pt does not have facial droop. Pt does appear to have some trouble completing sentences but no slurred speech noted. Pt denies numbness of weakness. Pt is currently in NAD.

## 2021-12-14 ENCOUNTER — Ambulatory Visit: Payer: Medicare HMO | Attending: Internal Medicine | Admitting: Internal Medicine

## 2021-12-14 ENCOUNTER — Ambulatory Visit (INDEPENDENT_AMBULATORY_CARE_PROVIDER_SITE_OTHER): Payer: Medicare HMO

## 2021-12-14 ENCOUNTER — Encounter: Payer: Self-pay | Admitting: Internal Medicine

## 2021-12-14 VITALS — BP 128/70 | HR 97 | Ht 71.0 in | Wt 219.0 lb

## 2021-12-14 DIAGNOSIS — R0789 Other chest pain: Secondary | ICD-10-CM

## 2021-12-14 DIAGNOSIS — E1169 Type 2 diabetes mellitus with other specified complication: Secondary | ICD-10-CM | POA: Diagnosis not present

## 2021-12-14 DIAGNOSIS — G459 Transient cerebral ischemic attack, unspecified: Secondary | ICD-10-CM

## 2021-12-14 DIAGNOSIS — I10 Essential (primary) hypertension: Secondary | ICD-10-CM | POA: Diagnosis not present

## 2021-12-14 DIAGNOSIS — E785 Hyperlipidemia, unspecified: Secondary | ICD-10-CM

## 2021-12-14 NOTE — Patient Instructions (Signed)
Medication Instructions:  Your Physician recommend you continue on your current medication as directed.    *If you need a refill on your cardiac medications before your next appointment, please call your pharmacy*   Lab Work: None ordered today   Testing/Procedures: Your physician has requested that you have an echocardiogram. Echocardiography is a painless test that uses sound waves to create images of your heart. It provides your doctor with information about the size and shape of your heart and how well your heart's chambers and valves are working.   You may receive an ultrasound enhancing agent through an IV if needed to better visualize your heart during the echo. This procedure takes approximately one hour.  There are no restrictions for this procedure.  This will take place at [address] [address], [place] [address]   Your physician has requested that you have a carotid duplex. This test is an ultrasound of the carotid arteries in your neck. It looks at blood flow through these arteries that supply the brain with blood.   Allow one hour for this exam.  There are no restrictions or special instructions.  This will take place at [address] [address], [place] [address]   Your physician has recommended that you wear a 14 day Zio AT Live monitor.   This monitor is a medical device that records the heart's electrical activity. Doctors most often use these monitors to diagnose arrhythmias. Arrhythmias are problems with the speed or rhythm of the heartbeat. The monitor is a small device applied to your chest. You can wear one while you do your normal daily activities. While wearing this monitor if you have any symptoms to push the button and record what you felt. Once you have worn this monitor for the period of time provider prescribed (Usually 14 days), you will return the monitor device in the postage paid box/bag. Once it is  returned they will download the data collected and provide [place] with a report which the provider will then review and we will call you with those results.   Important tips:  Avoid showering during the first 24 hours of wearing the monitor. Avoid excessive sweating to help maximize wear time. Do not submerge the device, no hot tubs, and no swimming pools. Keep any lotions or oils away from the patch. After 24 hours you may shower with the patch on. Take brief showers with your back facing the shower head.  Do not remove patch once it has been placed because that will interrupt data and decrease adhesive wear time. Push the button when you have any symptoms and write down what you were feeling. Once you have completed wearing your monitor, remove and place into box which has postage paid and place in your outgoing mailbox.  If for some reason you have misplaced your box then call our office and we can provide another box and/or mail it off for you. Keep the transmitter within 10 feet at all times.  Expect a welcome phone call within 24-48 hrs of application from Zio.  This call will include your copay information, so please answer any unknown phone calls while wearing Zio (it could also be important information about your heart) The envelope to return Zio is in the back of the transmitter. Removal instructions are on the last page of the symptom diary.  Place the patch sticky side up inside the transmitter and the symptom diary inside the envelope to return  on your last wear day inside your mailbox or any [employer].    Follow-Up: At [facility], you and your health needs are our priority.  As part of our continuing mission to provide you with exceptional heart care, we have created designated Provider Care Teams.  These Care Teams include your primary Cardiologist (physician) and Advanced Practice Providers (APPs -  Physician Assistants and Nurse Practitioners) who all work together to  provide you with the care you need, when you need it.  We recommend signing up for the patient portal called "MyChart".  Sign up information is provided on this After Visit Summary.  MyChart is used to connect with patients for Virtual Visits (Telemedicine).  Patients are able to view lab/test results, encounter notes, upcoming appointments, etc.  Non-urgent messages can be sent to your provider as well.   To learn more about what you can do with MyChart, go to [email or web address].    Your next appointment:   4-6 week(s)  The format for your next appointment:   In Person  Provider:   You may see Yvonne Kendall, MD or one of the following Advanced Practice Providers on your designated Care Team:   Nicolasa Ducking, NP Eula Listen, PA-C Cadence Fransico Michael, PA-C Charlsie Quest, NP

## 2021-12-14 NOTE — Progress Notes (Signed)
New Outpatient Visit Date: 12/14/2021  Primary Care Provider: Kennis Carina, MD [address] [place],  [place] [address]  Chief Complaint: Difficulty speaking  HPI:  Mr. Seelbach is a 71 y.o. male who is being seen today as a self-referral for evaluation of speech difficult. He has a history of hypertension, hyperlipidemia, type 2 diabetes mellitus.  He had been in his usual state of health until 2 days ago when he suddenly began having difficulty getting words out while having a stressful conversation with one of his sons.  He was unable to complete full sentences.  His wife urged him to go to the ER for further evaluation, where he ultimately went about 3.5 hours after symptom onset.  In the ED, head CT showed no acute abnormality.  Labs were unrevealing except for random glucose of 166; HS-TnI was negative x 2.  He left the ED without being seen by a provider, as his symptoms had resolved.  EKG showed sinus arrhythmia with RBBB.  Had seen Dr. Weber Cooks in the past at [facility], last visit in 2018.  Today, Mr. Korte feels close to normal though he still has a bit of a headache, describing it as if something were "clumping around in my head) when he coughs or sneezes.  He attributes this to allergies, as he has been sneezing quite a lot lately.  His thoughts also still seem a bit foggy, though he has not been sleeping as much since the event on Monday.  He notes occasional sporadic chest pain that he describes as "indigestion."  This has been present off and on for years.  He does not have any exertional chest pain.  He has stable exertional dyspnea with very strenuous activities that he attributes to being out of shape.  He has not had any palpitations, lightheadedness, or edema.  --------------------------------------------------------------------------------------------------  Cardiovascular History & Procedures: Cardiovascular Problems: Possible TIA  Risk Factors: Possible TIA,  hypertension, hyperlipidemia, type 2 diabetes mellitus, male gender, obesity, age greater than 27, and prior tobacco use  Cath/PCI: None  CV Surgery: None  EP Procedures and Devices: None  Non-Invasive Evaluation(s): Exercise tolerance test (06/11/2013, [facility]): Low risk study without evidence of ischemia.  Patient exercised 8 minutes, achieving 101% MPHR. TTE (06/11/2013, [facility]): Normal LV size with moderate LVH.  LVEF greater than 55%.  Mild mitral regurgitation.  Trivial aortic, tricuspid, and pulmonic regurgitation.  Recent CV Pertinent Labs: Lab Results  Component Value Date   INR 1.0 12/12/2021   K 4.1 12/12/2021   BUN 16 12/12/2021   CREATININE 0.87 12/12/2021    --------------------------------------------------------------------------------------------------  Past Medical History:  Diagnosis Date   BPH (benign prostatic hyperplasia)    Chronic pain of right knee    DM II (diabetes mellitus, type II), controlled (HCC)    Esophageal stricture    Family history of colon cancer    sister (in her 68s)   GERD (gastroesophageal reflux disease)    Hypercholesteremia    Hypertension    Wears hearing aid in both ears     Past Surgical History:  Procedure Laterality Date   CERVICAL FUSION     c5-c6   COLONOSCOPY W/ BIOPSIES AND POLYPECTOMY     ESOPHAGOGASTRODUODENOSCOPY (EGD) WITH ESOPHAGEAL DILATION     MYRINGOTOMY WITH TUBE PLACEMENT Left 10/21/2021   Procedure: MYRINGOTOMY WITH TUBE PLACEMENT WITH BUTTERFLY TUBE;  Surgeon: Linus Salmons, MD;  Location: [facility];  Service: ENT;  Laterality: Left;  Diabetic  REPLACEMENT TOTAL KNEE Right    REPLACEMENT TOTAL KNEE Right     Current Meds  Medication Sig   amLODipine (NORVASC) 5 MG tablet Take 5 mg by mouth daily.   Ascorbic Acid (VITAMIN C) 1000 MG tablet Take 1,000 mg by mouth daily.   ASPIRIN 81 PO Take by mouth daily.   cetirizine (ZYRTEC) 10 MG tablet Take 10 mg by mouth daily  as needed for allergies.   finasteride (PROSCAR) 5 MG tablet Take 5 mg by mouth daily.   fluticasone (FLONASE) 50 MCG/ACT nasal spray Place 2 sprays into both nostrils daily.   lisinopril (ZESTRIL) 40 MG tablet Take 40 mg by mouth daily.   lovastatin (MEVACOR) 40 MG tablet Take 40 mg by mouth at bedtime.   metFORMIN (GLUCOPHAGE) 500 MG tablet Take 1,000 mg by mouth 2 (two) times daily with a meal.   pantoprazole (PROTONIX) 40 MG tablet Take 40 mg by mouth daily.   tamsulosin (FLOMAX) 0.4 MG CAPS capsule Take 0.4 mg by mouth daily.    Allergies: Patient has no known allergies.  Social History   Tobacco Use   Smoking status: Former    Types: Cigarettes    Quit date: 1971    Years since quitting: 52.9   Smokeless tobacco: Never  Vaping Use   Vaping Use: Never used  Substance Use Topics   Alcohol use: Yes    Alcohol/week: 2.0 standard drinks of alcohol    Types: 2 Cans of beer per week   Drug use: Never    Family History  Problem Relation Age of Onset   Diabetes Mother    Heart disease Father     Review of Systems: A 12-system review of systems was performed and was negative except as noted in the HPI.  --------------------------------------------------------------------------------------------------  Physical Exam: BP 128/70 (BP Location: Left Arm)   Pulse 97   Ht 5\' 11"  (1.803 m)   Wt 219 lb (99.3 kg)   SpO2 97%   BMI 30.54 kg/m  Repeat BP: 128/70  General: NAD.  Accompanied by his wife. HEENT: No conjunctival pallor or scleral icterus. Neck: Supple without lymphadenopathy, thyromegaly, JVD, or HJR. No carotid bruit. Lungs: Normal work of breathing. Clear to auscultation bilaterally without wheezes or crackles. Heart: Regular rate and rhythm without murmurs, rubs, or gallops. Non-displaced PMI. Abd: Bowel sounds present. Soft, NT/ND without hepatosplenomegaly Ext: No lower extremity edema. Radial, PT, and DP pulses are 2+ bilaterally Skin: Warm and dry without  rash. Neuro: CNIII-XII intact. Strength and fine-touch sensation intact in upper and lower extremities bilaterally (hip flexion mildly limited on the right due to pain related to knee replacement). Psych: Normal mood and affect.  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  Lab Results  Component Value Date   WBC 7.5 12/12/2021   HGB 13.5 12/12/2021   HCT 41.6 12/12/2021   MCV 87.8 12/12/2021   PLT 245 12/12/2021    Lab Results  Component Value Date   NA 141 12/12/2021   K 4.1 12/12/2021   CL 107 12/12/2021   CO2 26 12/12/2021   BUN 16 12/12/2021   CREATININE 0.87 12/12/2021   GLUCOSE 166 (H) 12/12/2021   ALT 35 12/12/2021    --------------------------------------------------------------------------------------------------  ASSESSMENT AND PLAN: TIA: I am concerned that Mr. Abreu difficulty speaking earlier this week may have represented a TIA.  Workup in the emergency department was unrevealing, though he was not actually seen by a provider.  His speech  is back to normal today.  I do not appreciate any focal neurologic deficits on exam today.  He is already on aspirin and statin therapy, which we will continue.  We will arrange for expedited echocardiogram and carotid Dopplers as well as a 14-day event monitor.  Urgent neurology consultation has also been placed.  I advised him to call 911 if he has any further speech difficulty or other focal neurologic changes.  Chest pain: Mr. Newby has a long history of atypical chest pain that he attributes to indigestion.  He also has stable exertional dyspnea.  EKG today shows normal sinus rhythm with right bundle branch block and possible inferior infarct.  I think he would benefit from ischemia workup with coronary CTA versus myocardial perfusion stress testing once he has completed workup for suspected TIA.  Continue aspirin and statin therapy.  Hypertension: Blood pressure initially mildly elevated  but better on recheck at 128/70.  We will continue his current regimen of amlodipine and lisinopril to target a blood pressure less than 130/80.  Hyperlipidemia associated with type 2 diabetes mellitus: Most recent lipid panel in August through Dr. Verdell Carmine office was notable for total cholesterol 146, triglycerides 131, HDL 48, and LDL 72.  We will continue lovastatin for now, though I would have a low threshold for increasing to a high intensity statin for target LDL less than 70, particularly if there is evidence of ASCVD on aforementioned testing.  Follow-up: Return to clinic shortly after aforementioned testing (approximately 4-6 weeks).  Yvonne Kendall, MD 12/14/2021 11:40 AM

## 2021-12-17 DIAGNOSIS — G459 Transient cerebral ischemic attack, unspecified: Secondary | ICD-10-CM | POA: Diagnosis not present

## 2021-12-20 ENCOUNTER — Ambulatory Visit (HOSPITAL_BASED_OUTPATIENT_CLINIC_OR_DEPARTMENT_OTHER)
Admission: RE | Admit: 2021-12-20 | Discharge: 2021-12-20 | Disposition: A | Discharge status: Home / Self Care | Payer: Medicare HMO | Source: Ambulatory Visit | Attending: Internal Medicine | Admitting: Internal Medicine

## 2021-12-20 ENCOUNTER — Ambulatory Visit (HOSPITAL_COMMUNITY)
Admission: RE | Admit: 2021-12-20 | Discharge: 2021-12-20 | Disposition: A | Discharge status: Home / Self Care | Payer: Medicare HMO | Source: Ambulatory Visit | Attending: Internal Medicine | Admitting: Internal Medicine

## 2021-12-20 DIAGNOSIS — I10 Essential (primary) hypertension: Secondary | ICD-10-CM | POA: Diagnosis not present

## 2021-12-20 DIAGNOSIS — I3481 Nonrheumatic mitral (valve) annulus calcification: Secondary | ICD-10-CM | POA: Insufficient documentation

## 2021-12-20 DIAGNOSIS — E785 Hyperlipidemia, unspecified: Secondary | ICD-10-CM | POA: Insufficient documentation

## 2021-12-20 DIAGNOSIS — R079 Chest pain, unspecified: Secondary | ICD-10-CM | POA: Diagnosis not present

## 2021-12-20 DIAGNOSIS — G459 Transient cerebral ischemic attack, unspecified: Secondary | ICD-10-CM | POA: Diagnosis present

## 2021-12-20 DIAGNOSIS — E119 Type 2 diabetes mellitus without complications: Secondary | ICD-10-CM | POA: Diagnosis not present

## 2021-12-20 LAB — ECHOCARDIOGRAM COMPLETE BUBBLE STUDY
AR max vel: 1.87 cm2
AV Area VTI: 1.9 cm2
AV Area mean vel: 1.82 cm2
AV Mean grad: 4 mmHg
AV Peak grad: 7 mmHg
Ao pk vel: 1.32 m/s
Area-P 1/2: 2.99 cm2
S' Lateral: 3.7 cm

## 2021-12-20 NOTE — Progress Notes (Incomplete)
Echocardiogram 2D Echocardiogram has been performed.  Ronald Aguilar 12/20/2021, 2:30 PM

## 2021-12-21 ENCOUNTER — Telehealth: Payer: Self-pay | Admitting: Internal Medicine

## 2021-12-21 DIAGNOSIS — E1169 Type 2 diabetes mellitus with other specified complication: Secondary | ICD-10-CM

## 2021-12-21 MED ORDER — ATORVASTATIN CALCIUM 40 MG PO TABS: 40.0000 mg | ORAL_TABLET | Freq: Every day | ORAL | 0 refills | Status: DC

## 2021-12-21 NOTE — Telephone Encounter (Signed)
-----   Message from Yvonne Kendall, MD sent at 12/21/2021 10:21 AM EST ----- Please let Ronald Aguilar know that his carotid Dopplers show mild plaque buildup in his neck arteries but no significant blockage.  The echocardiogram shows that his heart is contracting well.  No significant valvular abnormalities were identified.  Overall, the studies are reassuring.  Given that some plaque was noted in his carotid arteries and his symptoms are concerning for a TIA, I suggest that we switch his cholesterol medicine from lovastatin to atorvastatin 40 mg daily.  He should have a repeat lipid panel and ALT in about 3 months.  We will also follow-up with him again after completion of the event monitor.  He should let us know if any questions or concerns come up in the meantime.  He should also proceed with neurology consultation next week, as scheduled.

## 2021-12-21 NOTE — Telephone Encounter (Signed)
Pt is returning call in regards to results. Requesting call back.  

## 2021-12-21 NOTE — Telephone Encounter (Signed)
Reviewed results and recommendations with patient. He requested only 30 day supply to be sent to [facility] in [place] and then when he is seen back in office he will then get it sent to his mail order pharmacy. We also discussed repeat labs to be done. He verbalized understanding of our conversation, agreement with plan, and had no further questions at this time.

## 2021-12-21 NOTE — Telephone Encounter (Signed)
-----   Message from Christopher End, MD sent at 12/21/2021 10:21 AM EST ----- Please let Ronald Aguilar know that his carotid Dopplers show mild plaque buildup in his neck arteries but no significant blockage.  The echocardiogram shows that his heart is contracting well.  No significant valvular abnormalities were identified.  Overall, the studies are reassuring.  Given that some plaque was noted in his carotid arteries and his symptoms are concerning for a TIA, I suggest that we switch his cholesterol medicine from lovastatin to atorvastatin 40 mg daily.  He should have a repeat lipid panel and ALT in about 3 months.  We will also follow-up with him again after completion of the event monitor.  He should let us know if any questions or concerns come up in the meantime.  He should also proceed with neurology consultation next week, as scheduled. 

## 2021-12-23 ENCOUNTER — Encounter: Payer: Self-pay | Admitting: Internal Medicine

## 2021-12-27 ENCOUNTER — Encounter: Payer: Self-pay | Admitting: Neurology

## 2021-12-27 ENCOUNTER — Ambulatory Visit: Payer: Medicare HMO | Admitting: Neurology

## 2021-12-27 VITALS — BP 163/93 | HR 95 | Ht 71.0 in | Wt 222.2 lb

## 2021-12-27 DIAGNOSIS — E785 Hyperlipidemia, unspecified: Secondary | ICD-10-CM

## 2021-12-27 DIAGNOSIS — R413 Other amnesia: Secondary | ICD-10-CM

## 2021-12-27 DIAGNOSIS — G459 Transient cerebral ischemic attack, unspecified: Secondary | ICD-10-CM | POA: Diagnosis not present

## 2021-12-27 DIAGNOSIS — E1169 Type 2 diabetes mellitus with other specified complication: Secondary | ICD-10-CM | POA: Diagnosis not present

## 2021-12-27 DIAGNOSIS — I10 Essential (primary) hypertension: Secondary | ICD-10-CM | POA: Diagnosis not present

## 2021-12-27 DIAGNOSIS — E119 Type 2 diabetes mellitus without complications: Secondary | ICD-10-CM | POA: Diagnosis not present

## 2021-12-27 DIAGNOSIS — G4733 Obstructive sleep apnea (adult) (pediatric): Secondary | ICD-10-CM

## 2021-12-27 NOTE — Progress Notes (Signed)
[facility]  PATIENT: Ronald Aguilar. DOB: 01-Mar-1950  REFERRING DOCTOR OR PCP: Cristal Deer and MD (cardiology); Gwen Pounds, MD (PCP) SOURCE: Patient, son, notes from cardiology and PCP, imaging and lab reports, imaging personally reviewed.  _________________________________   HISTORICAL  CHIEF COMPLAINT:  Chief Complaint  Patient presents with   Follow-up    Pt in room #11 with his son. Pt here today to discus TIA and speech difficulty    HISTORY OF PRESENT ILLNESS:  I had the pleasure seeing your patient, Ronald Aguilar, at [facility] for neurologic consultation regarding his transient aphasia   He is a 71 yo man who was talking to his wife during a normal day and then had difficulty with speech.  He had trouble forming words.  He was able to understand his wife.   He knows symptoms started quickly but is unsure how fast they resolved.    He felt he was back to baseline while in the ED 3 hours later.    He had a CT 12/12/2021 that showed no acute findings.  He as no other TIA sign/symptom.   He has been on bASA (recommended by cardiology).      He is wearing a heart monitor.  Carotid U/S was normal for age (1-39%).  ECHO showed aortic valve calcifications but no stenosis.  EF% was normal.  Bubble study was negative.  His son noted his father has had some memory issues since a head injury with LOC x 30 seconds (fell off roof).  He has no memory of that day until he was in the ED.   Head and neck CT were fine.     Vascular risk factors:  He has HTN, elevated cholesterol, Type 2 NIDDM (since 2019) and remote smoking history (quit at age 54).  He s on amlodipine, lisinopril and atorvastatin and metformin  He snores and wife has noted some OSA signs.   He has nocturia.  He has gained a few pounds the last couple years.  He worked as a Pharmacist, community SLEEPINESS SCALE  On a scale of 0 - 3 what is the chance of dozing:  Sitting and  Reading:   2 Watching TV:    2 Sitting inactive in a public place: 1 Passenger in car for one hour: 0 Lying down to rest in the afternoon: 3 Sitting and talking to someone: 0 Sitting quietly after lunch:  1 In a car, stopped in traffic:  0  Total (out of 24):    9/24   borderline sleepiness  DATA Imaging I personally reviewed the CT scan from 12/12/2021.  No atrophy is noted.  No ischemic changes are noted.  Carotid ultrasound 12/20/2021 showed 1 to 39% stenosis bilaterally in the internal carotid arteries.  This is normal for age.  Echocardiogram 12/20/2021 with bubble study showed normal EF% normal systolic and diastolic function, calcification in the aortic valve but no aortic stenosis.  The bubble study was negative.   REVIEW OF SYSTEMS: Constitutional: No fevers, chills, sweats, or change in appetite Eyes: No visual changes, double vision, eye pain Ear, nose and throat: No hearing loss, ear pain, nasal congestion, sore throat Cardiovascular: No chest pain, palpitations Respiratory:  No shortness of breath at rest or with exertion.   No wheezes GastrointestinaI: No nausea, vomiting, diarrhea, abdominal pain, fecal incontinence Genitourinary:  No dysuria, urinary retention or frequency.  No nocturia. Musculoskeletal:  No neck pain, back pain Integumentary: No rash, pruritus,  skin lesions Neurological: as above Psychiatric: No depression at this time.  No anxiety Endocrine: No palpitations, diaphoresis, change in appetite, change in weigh or increased thirst Hematologic/Lymphatic:  No anemia, purpura, petechiae. Allergic/Immunologic: No itchy/runny eyes, nasal congestion, recent allergic reactions, rashes  ALLERGIES: No Known Allergies  HOME MEDICATIONS:  Current Outpatient Medications:    amLODipine (NORVASC) 5 MG tablet, Take 5 mg by mouth daily., Disp: , Rfl:    Ascorbic Acid (VITAMIN C) 1000 MG tablet, Take 1,000 mg by mouth daily., Disp: , Rfl:    ASPIRIN 81 PO,  Take by mouth daily., Disp: , Rfl:    atorvastatin (LIPITOR) 40 MG tablet, Take 1 tablet (40 mg total) by mouth daily., Disp: 30 tablet, Rfl: 0   cetirizine (ZYRTEC) 10 MG tablet, Take 10 mg by mouth daily as needed for allergies., Disp: , Rfl:    finasteride (PROSCAR) 5 MG tablet, Take 5 mg by mouth daily., Disp: , Rfl:    fluticasone (FLONASE) 50 MCG/ACT nasal spray, Place 2 sprays into both nostrils daily., Disp: , Rfl:    lisinopril (ZESTRIL) 40 MG tablet, Take 40 mg by mouth daily., Disp: , Rfl:    metFORMIN (GLUCOPHAGE) 500 MG tablet, Take 1,000 mg by mouth 2 (two) times daily with a meal., Disp: , Rfl:    pantoprazole (PROTONIX) 40 MG tablet, Take 40 mg by mouth daily., Disp: , Rfl:    tamsulosin (FLOMAX) 0.4 MG CAPS capsule, Take 0.4 mg by mouth daily., Disp: , Rfl:   PAST MEDICAL HISTORY: Past Medical History:  Diagnosis Date   BPH (benign prostatic hyperplasia)    Chronic pain of right knee    DM II (diabetes mellitus, type II), controlled (HCC)    Esophageal stricture    Family history of colon cancer    sister (in her 65s)   GERD (gastroesophageal reflux disease)    Hypercholesteremia    Hypertension    Wears hearing aid in both ears     PAST SURGICAL HISTORY: Past Surgical History:  Procedure Laterality Date   CERVICAL FUSION     c5-c6   COLONOSCOPY W/ BIOPSIES AND POLYPECTOMY     ESOPHAGOGASTRODUODENOSCOPY (EGD) WITH ESOPHAGEAL DILATION     MYRINGOTOMY WITH TUBE PLACEMENT Left 10/21/2021   Procedure: MYRINGOTOMY WITH TUBE PLACEMENT WITH BUTTERFLY TUBE;  Surgeon: Linus Salmons, MD;  Location: [facility];  Service: ENT;  Laterality: Left;  Diabetic   REPLACEMENT TOTAL KNEE Right    REPLACEMENT TOTAL KNEE Right     FAMILY HISTORY: Family History  Problem Relation Age of Onset   Diabetes Mother    Heart disease Father     SOCIAL HISTORY: Social History   Socioeconomic History   Marital status: Married    Spouse name: Not on file   Number of  children: Not on file   Years of education: Not on file   Highest education level: Not on file  Occupational History   Not on file  Tobacco Use   Smoking status: Former    Types: Cigarettes    Quit date: 1971    Years since quitting: 53.0   Smokeless tobacco: Never  Vaping Use   Vaping Use: Never used  Substance and Sexual Activity   Alcohol use: Yes    Alcohol/week: 2.0 standard drinks of alcohol    Types: 2 Cans of beer per week   Drug use: Never   Sexual activity: Not on file  Other Topics Concern   Not on file  Social History Narrative   Not on file   Social Determinants of Health   Financial Resource Strain: Not on file  Food Insecurity: Not on file  Transportation Needs: Not on file  Physical Activity: Not on file  Stress: Not on file  Social Connections: Not on file  Intimate Partner Violence: Not on file       PHYSICAL EXAM  Vitals:   12/27/21 0826  BP: (!) 163/93  Pulse: 95  Weight: 222 lb 3.2 oz (100.8 kg)  Height: 5\' 11"  (1.803 m)   Repeat BP   135/78 - normal  Body mass index is 30.99 kg/m.   General: The patient is well-developed and well-nourished and in no acute distress  HEENT:  Head is [place]/AT.  Sclera are anicteric.  Funduscopic exam shows normal optic discs and retinal vessels.  Neck: No carotid bruits are noted.  The neck is nontender.  Cardiovascular: The heart has a regular rate and rhythm with a normal S1 and S2. There were no murmurs, gallops or rubs.    Skin: Extremities are without rash or  edema.  Musculoskeletal:  Back is nontender  Neurologic Exam  Mental status: The patient is alert and oriented x 3 at the time of the examination. The patient has apparent normal recent and remote memory, with an apparently normal attention span and concentration ability.   Speech is normal.  Cranial nerves: Extraocular movements are full. Pupils are equal, round, and reactive to light and accomodation.  Visual fields are full.  Facial  symmetry is present. There is good facial sensation to soft touch bilaterally.Facial strength is normal.  Trapezius and sternocleidomastoid strength is normal. No dysarthria is noted.  The tongue is midline, and the patient has symmetric elevation of the soft palate. No obvious hearing deficits are noted.  Motor:  Muscle bulk is normal.   Tone is normal. Strength is  5 / 5 in all 4 extremities.   Sensory: Sensory testing is intact to pinprick, soft touch and vibration sensation in all 4 extremities.  Coordination: Cerebellar testing reveals good finger-nose-finger and heel-to-shin bilaterally.  Gait and station: Station is normal.   Gait is normal. Tandem gait is normal. Romberg is negative.   Reflexes: Deep tendon reflexes are symmetric and normal bilaterally.   Plantar responses are flexor.    DIAGNOSTIC DATA (LABS, IMAGING, TESTING) - I reviewed patient records, labs, notes, testing and imaging myself where available.  Lab Results  Component Value Date   WBC 7.5 12/12/2021   HGB 13.5 12/12/2021   HCT 41.6 12/12/2021   MCV 87.8 12/12/2021   PLT 245 12/12/2021      Component Value Date/Time   NA 141 12/12/2021 1632   K 4.1 12/12/2021 1632   CL 107 12/12/2021 1632   CO2 26 12/12/2021 1632   GLUCOSE 166 (H) 12/12/2021 1632   BUN 16 12/12/2021 1632   CREATININE 0.87 12/12/2021 1632   CALCIUM 9.8 12/12/2021 1632   PROT 7.4 12/12/2021 1632   ALBUMIN 4.2 12/12/2021 1632   AST 34 12/12/2021 1632   ALT 35 12/12/2021 1632   ALKPHOS 49 12/12/2021 1632   BILITOT 1.1 12/12/2021 1632   GFRNONAA >60 12/12/2021 1632     ASSESSMENT AND PLAN  TIA (transient ischemic attack) - Plan: Home sleep test  Hyperlipidemia associated with type 2 diabetes mellitus (HCC)  Essential hypertension  Type 2 diabetes mellitus without complication, without long-term current use of insulin (HCC)  OSA (obstructive sleep apnea) - Plan: Home sleep  test  Memory loss   In summary, Mr. Mitzi HansenMoody is a  71 year old man with a transient ischemic attack/reversible ischemic neurologic deficit about 2 weeks ago.  It is uncertain how long the aphasia lasted but he was completely back to baseline by 3 hours.  His exam today was normal for age.  He has several vascular risk factors including hypertension, diabetes, elevated lipids.  We discussed the importance of keeping all of these under good control.  Carotid ultrasound and echocardiogram did not show any source of the event.  A heart monitor (2 weeks) is still pending.  He does have snoring, borderline sleepiness and witnessed OSA.  We will check a home sleep study.  If he has moderate or severe sleep apnea I would recommend that we start CPAP.  If an additional event occurs or if the family notes memory worsens I will check an MRI of the brain and MR angiogram of the intracranial arteries to rule out intracranial stenosis.  A follow-up was not scheduled but he will follow-up with me if he ends up requiring CPAP.  He should call if he has any new or worsening neurologic event.  Thank you for asking me to see Mr. Mitzi HansenMoody.  Please let me know if I can be of further assistance with him or other patients in the future.   Kita Neace A. Epimenio FootSater, MD, [facility],FAAN 12/27/2021, 9:33 AM Certified in Neurology, Clinical Neurophysiology, Sleep Medicine and Neuroimaging  [facility] [address], [address] [place], [place] [address] [phone]

## 2022-01-05 ENCOUNTER — Other Ambulatory Visit: Payer: Self-pay | Admitting: Internal Medicine

## 2022-01-07 ENCOUNTER — Other Ambulatory Visit: Payer: Self-pay | Admitting: Internal Medicine

## 2022-01-10 MED ORDER — ATORVASTATIN CALCIUM 40 MG PO TABS: 40.0000 mg | ORAL_TABLET | Freq: Every day | ORAL | 0 refills | Status: DC

## 2022-01-18 NOTE — Progress Notes (Signed)
Follow-up Outpatient Visit Date: 01/19/2022  Primary Care Provider: Ashley Jacobs, MD [place] [address]  Chief Complaint: Follow-up TIA  HPI:  Ronald Aguilar is a 72 y.o. male with history of hypertension, hyperlipidemia and type 2 diabetes mellitus, who presents for follow-up of TIAs.  I met him in mid December for evaluation of transient word finding difficulty.  He presented to the ED but left after head CT and labs were performed but before seeing a provider.  At our visit, he was feeling close to normal though he still had a mild headache when coughing/sneezing.  He also endorsed occasional chest pain described as "indigestion."  No focal neurologic deficits were appreciated on exam.  Subsequent carotid Dopplers and echo showed mild carotid artery plaquing without significant stenosis and normal LVEF without significant valvular abnormalities.  He was referred to neurology, where it was thought that his symptoms were consistent with a TIA.  Exam was normal for age at that visit.  He was wearing 14-day event monitor that we had ordered, which did not show evidence of atrial fibrillation/flutter.  He was referred for sleep study by Dr. Felecia Shelling.  Today, Ronald Aguilar reports that he has been feeling fairly well.  He has not had any new focal neurologic changes including word finding difficulties like what brought him to our attention last month.  He also denies chest pain, shortness of breath, palpitations, lightheadedness, and edema.  --------------------------------------------------------------------------------------------------  Cardiovascular History & Procedures: Cardiovascular Problems: Possible TIA   Risk Factors: Possible TIA, hypertension, hyperlipidemia, type 2 diabetes mellitus, male gender, obesity, age greater than 62, and prior tobacco use   Cath/PCI: None   CV Surgery: None   EP Procedures and Devices: 14-day event monitor (12/14/2021):  Patient was monitored for 14 days.  Predominantly sinus rhythm with rare PACs and PVCs as well as several episodes of brief PSVT.  No atrial fibrillation/flutter.   Non-Invasive Evaluation(s): Carotid Doppler (12/20/2021): Mild bilateral internal carotid artery stenoses (1-39%).  Antegrade vertebral artery flow. TTE (12/20/2021): Normal LV size wall thickness.  LVEF 55-60% with normal wall motion and diastolic function.  Normal RV size and function.  Normal biatrial size.  Moderate mitral annular calcification with trivial regurgitation.  Normal tricuspid valve.  Aortic sclerosis without stenosis or regurgitation.  Normal CVP.  Negative bubble study. Exercise tolerance test (06/11/2013, [place] [facility]): Low risk study without evidence of ischemia.  Patient exercised 8 minutes, achieving 101% MPHR. TTE (06/11/2013, [facility]): Normal LV size with moderate LVH.  LVEF greater than 55%.  Mild mitral regurgitation.  Trivial aortic, tricuspid, and pulmonic regurgitation.  Recent CV Pertinent Labs: Lab Results  Component Value Date   INR 1.0 12/12/2021   K 4.1 12/12/2021   BUN 16 12/12/2021   CREATININE 0.87 12/12/2021    Past medical and surgical history were reviewed and updated in EPIC.  Current Meds  Medication Sig   amLODipine (NORVASC) 5 MG tablet Take 5 mg by mouth daily.   Ascorbic Acid (VITAMIN C) 1000 MG tablet Take 1,000 mg by mouth daily.   ASPIRIN 81 PO Take by mouth daily.   atorvastatin (LIPITOR) 40 MG tablet Take 1 tablet (40 mg total) by mouth daily.   cetirizine (ZYRTEC) 10 MG tablet Take 10 mg by mouth daily as needed for allergies.   finasteride (PROSCAR) 5 MG tablet Take 5 mg by mouth daily.   fluticasone (FLONASE) 50 MCG/ACT nasal spray Place 2 sprays into both nostrils daily.  lisinopril (ZESTRIL) 40 MG tablet Take 40 mg by mouth daily.   metFORMIN (GLUCOPHAGE) 500 MG tablet Take 1,000 mg by mouth 2 (two) times daily with a meal.   pantoprazole (PROTONIX) 40 MG  tablet Take 40 mg by mouth daily.   tamsulosin (FLOMAX) 0.4 MG CAPS capsule Take 0.4 mg by mouth daily.    Allergies: Patient has no known allergies.  Social History   Tobacco Use   Smoking status: Former    Types: Cigarettes    Quit date: 1971    Years since quitting: 53.0   Smokeless tobacco: Never  Vaping Use   Vaping Use: Never used  Substance Use Topics   Alcohol use: Yes    Alcohol/week: 2.0 standard drinks of alcohol    Types: 2 Cans of beer per week   Drug use: Never    Family History  Problem Relation Age of Onset   Diabetes Mother    Heart disease Father     Review of Systems: A 12-system review of systems was performed and was negative except as noted in the HPI.  --------------------------------------------------------------------------------------------------  Physical Exam: BP (!) 146/70 (BP Location: Left Arm, Patient Position: Sitting, Cuff Size: Large)   Pulse 71   Ht 5\' 11"  (1.803 m)   Wt 222 lb (100.7 kg)   SpO2 97%   BMI 30.96 kg/m   General:  NAD.  Accompanied by his wife. Neck: No JVD or HJR. Lungs: Clear to auscultation bilaterally without wheezes or crackles. Heart: Regular rate and rhythm without murmurs, rubs, or gallops. Abdomen: Soft, nontender, nondistended. Extremities: No lower extremity edema.   Lab Results  Component Value Date   WBC 7.5 12/12/2021   HGB 13.5 12/12/2021   HCT 41.6 12/12/2021   MCV 87.8 12/12/2021   PLT 245 12/12/2021    Lab Results  Component Value Date   NA 141 12/12/2021   K 4.1 12/12/2021   CL 107 12/12/2021   CO2 26 12/12/2021   BUN 16 12/12/2021   CREATININE 0.87 12/12/2021   GLUCOSE 166 (H) 12/12/2021   ALT 35 12/12/2021    No results found for: "CHOL", "HDL", "LDLCALC", "LDLDIRECT", "TRIG", "CHOLHDL"  --------------------------------------------------------------------------------------------------  ASSESSMENT AND PLAN: TIA: No new focal neurologic deficits reported.  Workup thus far  has been reassuring without evidence of atrial fibrillation, significant cardiac structural abnormality, or significant carotid artery stenosis.  Will plan to continue aspirin and statin therapy.  Continued follow-up with neurology encouraged.  We also need to work on blood pressure control given elevated blood pressure on initial check (improved on recheck).  Hypertension: Blood pressure mildly elevated today.  Will increase amlodipine to 10 mg daily.  Continue current dose of lisinopril.  Hyperlipidemia associated with type 2 diabetes mellitus: Continue atorvastatin in place of simvastatin for target LDL less than 70.  Ronald Aguilar is scheduled for follow-up labs with Dr. Jodi Mourning in the next few weeks.  Follow-up: Return to clinic in 6 months.  Nelva Bush, MD 01/19/2022 11:37 AM

## 2022-01-19 ENCOUNTER — Encounter: Payer: Self-pay | Admitting: Internal Medicine

## 2022-01-19 ENCOUNTER — Ambulatory Visit: Payer: Medicare HMO | Attending: Internal Medicine | Admitting: Internal Medicine

## 2022-01-19 VITALS — BP 146/70 | HR 71 | Ht 71.0 in | Wt 222.0 lb

## 2022-01-19 DIAGNOSIS — G459 Transient cerebral ischemic attack, unspecified: Secondary | ICD-10-CM

## 2022-01-19 DIAGNOSIS — I10 Essential (primary) hypertension: Secondary | ICD-10-CM | POA: Diagnosis not present

## 2022-01-19 DIAGNOSIS — E785 Hyperlipidemia, unspecified: Secondary | ICD-10-CM | POA: Diagnosis not present

## 2022-01-19 DIAGNOSIS — E1169 Type 2 diabetes mellitus with other specified complication: Secondary | ICD-10-CM

## 2022-01-19 MED ORDER — AMLODIPINE BESYLATE 10 MG PO TABS: 10.0000 mg | ORAL_TABLET | Freq: Every day | ORAL | Status: AC

## 2022-01-19 NOTE — Patient Instructions (Addendum)
Medication Instructions:  Your physician recommends the following medication changes.   INCREASE: Amlodipine to 10 mg by mouth daily (Call when ready for refill)   *If you need a refill on your cardiac medications before your next appointment, please call your pharmacy*   Lab Work: None ordered today   Testing/Procedures: None ordered today   Follow-Up: At [facility], you and your health needs are our priority.  As part of our continuing mission to provide you with exceptional heart care, we have created designated Provider Care Teams.  These Care Teams include your primary Cardiologist (physician) and Advanced Practice Providers (APPs -  Physician Assistants and Nurse Practitioners) who all work together to provide you with the care you need, when you need it.  We recommend signing up for the patient portal called "MyChart".  Sign up information is provided on this After Visit Summary.  MyChart is used to connect with patients for Virtual Visits (Telemedicine).  Patients are able to view lab/test results, encounter notes, upcoming appointments, etc.  Non-urgent messages can be sent to your provider as well.   To learn more about what you can do with MyChart, go to [email or web address].    Your next appointment:   6 month(s)  Provider:   You may see Nelva Bush, MD or one of the following Advanced Practice Providers on your designated Care Team:   Murray Hodgkins, NP Christell Faith, PA-C Cadence Kathlen Mody, PA-C Gerrie Nordmann, NP

## 2022-01-20 ENCOUNTER — Encounter: Payer: Self-pay | Admitting: Internal Medicine

## 2022-01-21 ENCOUNTER — Encounter: Payer: Self-pay | Admitting: Neurology

## 2022-01-23 NOTE — Telephone Encounter (Signed)
Hi Raquel Sarna can you help with this?  Thanks!

## 2022-02-13 ENCOUNTER — Other Ambulatory Visit: Payer: Self-pay | Admitting: Internal Medicine

## 2022-02-14 ENCOUNTER — Ambulatory Visit: Payer: Medicare HMO | Admitting: Neurology

## 2022-02-14 DIAGNOSIS — G4733 Obstructive sleep apnea (adult) (pediatric): Secondary | ICD-10-CM | POA: Diagnosis not present

## 2022-02-14 DIAGNOSIS — G459 Transient cerebral ischemic attack, unspecified: Secondary | ICD-10-CM

## 2022-02-15 ENCOUNTER — Other Ambulatory Visit: Payer: Self-pay | Admitting: Internal Medicine

## 2022-02-16 NOTE — Progress Notes (Signed)
   [facility]  HOME SLEEP STUDY  STUDY DATE: 02/14/2022 PATIENT NAME: Ronald Aguilar. DOB: December 20, 1950 MRN: [id number]  ORDERING CLINICIAN: Richard A. Felecia Shelling, MD, PhD INTERPRETING CLINICIAN: Richard A. Felecia Shelling, MD. PhD   CLINICAL INFORMATION: 72 year old man with snoring and borderline excessive daytime sleepiness ([place] 9/24).  He has had a TIA.   IMPRESSION:  Moderate OSA with a pAHI 3% = 25.2/h (severe during REM with 50.9/h).    ODI 4% was 14.9/h but 33.8/h during REM Negligible nocturnal hypoxemia with 1.8 minutes below SaO2 88%. Normal sleep efficiency of 93%.   RECOMMENDATION: Recommend AutoPap with a range 5 to 15 cm H2O and a heated humidifier to treat his moderate OSA that is more severe during REM sleep.  Download in 30 to 90 days. Follow-up with Dr. Felecia Shelling.   INTERPRETING PHYSICIAN:   Richard A. Felecia Shelling, MD, PhD, [facility] Certified in Neurology, Clinical Neurophysiology, Sleep Medicine, Pain Medicine and Neuroimaging  [facility] [address], [place],  [address] [phone]

## 2022-02-20 ENCOUNTER — Telehealth: Payer: Self-pay | Admitting: *Deleted

## 2022-02-20 DIAGNOSIS — G4733 Obstructive sleep apnea (adult) (pediatric): Secondary | ICD-10-CM

## 2022-02-20 NOTE — Telephone Encounter (Signed)
-----   Message from Britt Bottom, MD sent at 02/17/2022 10:00 AM EST ----- Regarding: Sleep study His home sleep study showed moderate sleep apnea.  If he is agreeable I would like to start AutoPap 5 to 15 cm H2O with a heated humidifier and download in 30 to 90 days with follow-up with me or Amy between 30 and 90 days  If he is hesitant to do CPAP would recommend weight loss and an oral appliance

## 2022-02-20 NOTE — Telephone Encounter (Signed)
Patient informed with below note. Patient declined Cpap, referral placed for oral appliance.

## 2022-02-20 NOTE — Telephone Encounter (Signed)
Called pt at [phone]. LVM for him to call about results.

## 2022-02-20 NOTE — Addendum Note (Signed)
Addended by: Thamas Jaegers on: 02/20/2022 10:44 AM   Modules accepted: Orders

## 2022-02-21 ENCOUNTER — Telehealth: Payer: Self-pay | Admitting: Neurology

## 2022-02-21 NOTE — Telephone Encounter (Signed)
Referral for Dentistry fax to [facility] [facility]. Phone: [phone], Fax: [phone].

## 2022-05-06 ENCOUNTER — Encounter: Payer: Self-pay | Admitting: Internal Medicine

## 2022-05-08 ENCOUNTER — Other Ambulatory Visit: Payer: Self-pay

## 2022-05-08 MED ORDER — ATORVASTATIN CALCIUM 40 MG PO TABS: 40.0000 mg | ORAL_TABLET | Freq: Every day | ORAL | 3 refills | Status: AC

## 2022-08-25 ENCOUNTER — Encounter: Payer: Self-pay | Admitting: Internal Medicine

## 2022-08-25 ENCOUNTER — Ambulatory Visit: Payer: Medicare HMO | Attending: Internal Medicine | Admitting: Internal Medicine

## 2022-08-25 VITALS — BP 134/80 | HR 66 | Ht 71.0 in | Wt 212.2 lb

## 2022-08-25 DIAGNOSIS — Z7984 Long term (current) use of oral hypoglycemic drugs: Secondary | ICD-10-CM

## 2022-08-25 DIAGNOSIS — E1169 Type 2 diabetes mellitus with other specified complication: Secondary | ICD-10-CM | POA: Diagnosis not present

## 2022-08-25 DIAGNOSIS — I10 Essential (primary) hypertension: Secondary | ICD-10-CM

## 2022-08-25 DIAGNOSIS — R9431 Abnormal electrocardiogram [ECG] [EKG]: Secondary | ICD-10-CM | POA: Diagnosis not present

## 2022-08-25 DIAGNOSIS — G459 Transient cerebral ischemic attack, unspecified: Secondary | ICD-10-CM | POA: Diagnosis not present

## 2022-08-25 DIAGNOSIS — E785 Hyperlipidemia, unspecified: Secondary | ICD-10-CM

## 2022-08-25 NOTE — Progress Notes (Unsigned)
Cardiology Office Note:  .   Date:  08/27/2022  ID:  Ronald Sabin., DOB 06-01-1950, MRN [id number] PCP: Kennis Carina, MD  [facility] Providers Cardiologist:  Yvonne Kendall, MD     History of Present Illness: .   Ronald Rosselot. is a 72 y.o. male with history of possible TIA, hypertension, hyperlipidemia, and type 2 diabetes mellitus, who presents for follow-up of TIAs.  I last saw him in January, at which time he was feeling well with resolution of previous word finding difficulties.  We agreed to increase amlodipine to 10 mg daily due to suboptimal blood pressure control.  Outpatient neurology follow-up was recommended.  Today, Ronald Aguilar reports that he has been feeling fairly well.  He has not had any further episodes of word finding difficulties or other neurologic events.  He notes being a little bit more fatigued than he is accustomed to, sometimes finding himself napping in the afternoons when given the opportunity.  He tries to remain active when possible.  He has not had any chest pain, shortness of breath, palpitations lightheadedness, or edema.  ROS: See HPI  Studies Reviewed: Marland Kitchen   EKG Interpretation Date/Time:  Friday August 25 2022 11:01:48 EDT Ventricular Rate:  66 PR Interval:  128 QRS Duration:  130 QT Interval:  420 QTC Calculation: 440 R Axis:   -17  Text Interpretation: Normal sinus rhythm Right bundle branch block Possible Inferior infarct , age undetermined When compared with ECG of 14-Dec-2021 HEART RATE has decreased Otherwise no significant change Confirmed by Tamaya Pun, Cristal Deer [phone]) on 08/27/2022 2:06:32 PM    EP Procedures and Devices: 14-day event monitor (12/14/2021): Patient was monitored for 14 days.  Predominantly sinus rhythm with rare PACs and PVCs as well as several episodes of brief PSVT.  No atrial fibrillation/flutter.   Non-Invasive Evaluation(s): Carotid Doppler (12/20/2021): Mild bilateral internal carotid artery stenoses (1-39%).   Antegrade vertebral artery flow. TTE (12/20/2021): Normal LV size wall thickness.  LVEF 55-60% with normal wall motion and diastolic function.  Normal RV size and function.  Normal biatrial size.  Moderate mitral annular calcification with trivial regurgitation.  Normal tricuspid valve.  Aortic sclerosis without stenosis or regurgitation.  Normal CVP.  Negative bubble study. Exercise tolerance test (06/11/2013, [facility]): Low risk study without evidence of ischemia.  Patient exercised 8 minutes, achieving 101% MPHR. TTE (06/11/2013, [facility]): Normal LV size with moderate LVH.  LVEF greater than 55%.  Mild mitral regurgitation.  Trivial aortic, tricuspid, and pulmonic regurgitation.  Risk Assessment/Calculations:            Physical Exam:   VS:  BP 134/80   Pulse 66   Ht 5\' 11"  (1.803 m)   Wt 212 lb 3.2 oz (96.3 kg)   SpO2 94%   BMI 29.60 kg/m    Wt Readings from Last 3 Encounters:  08/25/22 212 lb 3.2 oz (96.3 kg)  01/19/22 222 lb (100.7 kg)  12/27/21 222 lb 3.2 oz (100.8 kg)    General:  NAD. Neck: No JVD or HJR. Lungs: Clear to auscultation bilaterally without wheezes or crackles. Heart: Regular rate and rhythm without murmurs, rubs, or gallops. Abdomen: Soft, nontender, nondistended. Extremities: No lower extremity edema.  ASSESSMENT AND PLAN: .    TIA: No new neurologic events reported.  Workup thus far has been reassuring.  Continue aspirin and statin therapy.  Abnormal EKG: Tracing today again shows sinus rhythm with right bundle branch block and possible inferior infarct,  unchanged from prior tracings.  We discussed role for ischemia evaluation given conduction abnormalities and possible inferior infarct but have agreed to defer this given lack of symptoms reassuring echo with normal wall motion.  Hypertension: Blood pressure borderline elevated today.  Continue current medications, including amlodipine and lisinopril.  Hyperlipidemia associated with type 2  diabetes mellitus: Most recent lipid panel in May showed excellent LDL of 64 but mildly elevated triglycerides of 164.  This prompted escalation of atorvastatin by Dr. Clearance Coots.  Ronald Aguilar reports that he is scheduled for labs with his upcoming visit with Dr. Clearance Coots.    Dispo: Return to clinic in 6 months.  Signed, Yvonne Kendall, MD

## 2022-08-25 NOTE — Patient Instructions (Signed)
Medication Instructions:  Your physician recommends that you continue on your current medications as directed. Please refer to the Current Medication list given to you today.   *If you need a refill on your cardiac medications before your next appointment, please call your pharmacy*   Lab Work: No labs ordered today    Testing/Procedures: No test ordered today    Follow-Up: At [facility], you and your health needs are our priority.  As part of our continuing mission to provide you with exceptional heart care, we have created designated Provider Care Teams.  These Care Teams include your primary Cardiologist (physician) and Advanced Practice Providers (APPs -  Physician Assistants and Nurse Practitioners) who all work together to provide you with the care you need, when you need it.  We recommend signing up for the patient portal called "MyChart".  Sign up information is provided on this After Visit Summary.  MyChart is used to connect with patients for Virtual Visits (Telemedicine).  Patients are able to view lab/test results, encounter notes, upcoming appointments, etc.  Non-urgent messages can be sent to your provider as well.   To learn more about what you can do with MyChart, go to [email or web address].    Your next appointment:   6 month(s)  Provider:   You may see Yvonne Kendall, MD or one of the following Advanced Practice Providers on your designated Care Team:   Nicolasa Ducking, NP Eula Listen, PA-C Cadence Fransico Michael, PA-C Charlsie Quest, NP    Dr. Okey Dupre recommends checking and keeping a log of your blood pressure weekly. Please call the office if you notice your blood pressure is consistently greater than 130/80.  It is best to check your blood pressure 1-2 hours after taking your medications.  Below are some tips that our clinical pharmacists share for home BP monitoring:          Rest 10 minutes before taking your blood pressure.          Don't smoke or drink  caffeinated beverages for at least 30 minutes before.          Take your blood pressure before (not after) you eat.          Sit comfortably with your back supported and both feet on the floor (don't cross your legs).          Elevate your arm to heart level on a table or a desk.          Use the proper sized cuff. It should fit smoothly and snugly around your bare upper arm. There should be enough room to slip a fingertip under the cuff. The bottom edge of the cuff should be 1 inch above the crease of the elbow.

## 2022-08-27 ENCOUNTER — Encounter: Payer: Self-pay | Admitting: Internal Medicine

## 2022-08-27 DIAGNOSIS — R9431 Abnormal electrocardiogram [ECG] [EKG]: Secondary | ICD-10-CM | POA: Insufficient documentation

## 2022-10-23 ENCOUNTER — Other Ambulatory Visit: Payer: Self-pay

## 2022-10-23 ENCOUNTER — Observation Stay: Payer: Medicare HMO

## 2022-10-23 ENCOUNTER — Emergency Department: Payer: Medicare HMO

## 2022-10-23 ENCOUNTER — Observation Stay
Admission: EM | Admit: 2022-10-23 | Discharge: 2022-10-24 | Disposition: A | Discharge status: Home / Self Care | Payer: Medicare HMO | Attending: Osteopathic Medicine | Admitting: Osteopathic Medicine

## 2022-10-23 DIAGNOSIS — Z7982 Long term (current) use of aspirin: Secondary | ICD-10-CM | POA: Insufficient documentation

## 2022-10-23 DIAGNOSIS — R479 Unspecified speech disturbances: Secondary | ICD-10-CM | POA: Diagnosis present

## 2022-10-23 DIAGNOSIS — Z87891 Personal history of nicotine dependence: Secondary | ICD-10-CM | POA: Diagnosis not present

## 2022-10-23 DIAGNOSIS — Z96651 Presence of right artificial knee joint: Secondary | ICD-10-CM | POA: Diagnosis not present

## 2022-10-23 DIAGNOSIS — E119 Type 2 diabetes mellitus without complications: Secondary | ICD-10-CM | POA: Insufficient documentation

## 2022-10-23 DIAGNOSIS — E1169 Type 2 diabetes mellitus with other specified complication: Secondary | ICD-10-CM

## 2022-10-23 DIAGNOSIS — G459 Transient cerebral ischemic attack, unspecified: Principal | ICD-10-CM | POA: Insufficient documentation

## 2022-10-23 DIAGNOSIS — Z79899 Other long term (current) drug therapy: Secondary | ICD-10-CM | POA: Diagnosis not present

## 2022-10-23 DIAGNOSIS — I10 Essential (primary) hypertension: Secondary | ICD-10-CM | POA: Diagnosis not present

## 2022-10-23 LAB — COMPREHENSIVE METABOLIC PANEL
ALT: 24 U/L (ref 0–44)
AST: 27 U/L (ref 15–41)
Albumin: 3.9 g/dL (ref 3.5–5.0)
Alkaline Phosphatase: 41 U/L (ref 38–126)
Anion gap: 9 (ref 5–15)
BUN: 20 mg/dL (ref 8–23)
CO2: 22 mmol/L (ref 22–32)
Calcium: 8.6 mg/dL — ABNORMAL LOW (ref 8.9–10.3)
Chloride: 105 mmol/L (ref 98–111)
Creatinine, Ser: 0.88 mg/dL (ref 0.61–1.24)
GFR, Estimated: >60 mL/min (ref >60)
Glucose, Bld: 140 mg/dL — ABNORMAL HIGH (ref 70–99)
Potassium: 3.7 mmol/L (ref 3.5–5.1)
Sodium: 136 mmol/L (ref 135–145)
Total Bilirubin: 1.1 mg/dL (ref 0.3–1.2)
Total Protein: 6.8 g/dL (ref 6.5–8.1)

## 2022-10-23 LAB — CBC
HCT: 38 % — ABNORMAL LOW (ref 39.0–52.0)
Hemoglobin: 12.7 g/dL — ABNORMAL LOW (ref 13.0–17.0)
MCH: 29.2 pg (ref 26.0–34.0)
MCHC: 33.4 g/dL (ref 30.0–36.0)
MCV: 87.4 fL (ref 80.0–100.0)
Platelets: 246 10*3/uL (ref 150–400)
RBC: 4.35 MIL/uL (ref 4.22–5.81)
RDW: 12.7 % (ref 11.5–15.5)
WBC: 8.3 10*3/uL (ref 4.0–10.5)
nRBC: 0 % (ref 0.0–0.2)

## 2022-10-23 LAB — DIFFERENTIAL
Abs Immature Granulocytes: 0.03 10*3/uL (ref 0.00–0.07)
Basophils Absolute: 0.2 10*3/uL — ABNORMAL HIGH (ref 0.0–0.1)
Basophils Relative: 2 %
Eosinophils Absolute: 0.1 10*3/uL (ref 0.0–0.5)
Eosinophils Relative: 1 %
Immature Granulocytes: 0 %
Lymphocytes Relative: 18 %
Lymphs Abs: 1.5 10*3/uL (ref 0.7–4.0)
Monocytes Absolute: 0.5 10*3/uL (ref 0.1–1.0)
Monocytes Relative: 6 %
Neutro Abs: 6 10*3/uL (ref 1.7–7.7)
Neutrophils Relative %: 73 %

## 2022-10-23 LAB — URINE DRUG SCREEN, QUALITATIVE (ARMC ONLY)
Amphetamines, Ur Screen: NOT DETECTED
Barbiturates, Ur Screen: NOT DETECTED
Benzodiazepine, Ur Scrn: NOT DETECTED
Cannabinoid 50 Ng, Ur ~~LOC~~: NOT DETECTED
Cocaine Metabolite,Ur ~~LOC~~: NOT DETECTED
MDMA (Ecstasy)Ur Screen: NOT DETECTED
Methadone Scn, Ur: NOT DETECTED
Opiate, Ur Screen: NOT DETECTED
Phencyclidine (PCP) Ur S: NOT DETECTED
Tricyclic, Ur Screen: NOT DETECTED

## 2022-10-23 LAB — ETHANOL: Alcohol, Ethyl (B): <10 mg/dL (ref <10)

## 2022-10-23 LAB — PROTIME-INR
INR: 1.1 (ref 0.8–1.2)
Prothrombin Time: 14.1 s (ref 11.4–15.2)

## 2022-10-23 LAB — HEMOGLOBIN A1C
Hgb A1c MFr Bld: 6.5 % — ABNORMAL HIGH (ref 4.8–5.6)
Mean Plasma Glucose: 139.85 mg/dL

## 2022-10-23 LAB — APTT: aPTT: 27 s (ref 24–36)

## 2022-10-23 LAB — CBG MONITORING, ED
Glucose-Capillary: 98 mg/dL (ref 70–99)
Glucose-Capillary: 167 mg/dL — ABNORMAL HIGH (ref 70–99)

## 2022-10-23 MED ORDER — FLUTICASONE PROPIONATE 50 MCG/ACT NA SUSP: 2.0000 | Freq: Every day | NASAL | Status: DC | PRN

## 2022-10-23 MED ORDER — ATORVASTATIN CALCIUM 20 MG PO TABS
40.0000 mg | ORAL_TABLET | Freq: Every day | ORAL | Status: DC
  Administered 2022-10-23: 40 mg via ORAL
  Filled 2022-10-23: qty 2

## 2022-10-23 MED ORDER — FINASTERIDE 5 MG PO TABS
5.0000 mg | ORAL_TABLET | Freq: Every day | ORAL | Status: DC
  Administered 2022-10-24: 5 mg via ORAL
  Filled 2022-10-23: qty 1

## 2022-10-23 MED ORDER — LORAZEPAM 0.5 MG PO TABS: 0.5000 mg | ORAL_TABLET | Freq: Four times a day (QID) | ORAL | Status: DC | PRN

## 2022-10-23 MED ORDER — ACETAMINOPHEN 160 MG/5ML PO SOLN: 650.0000 mg | ORAL | Status: DC | PRN

## 2022-10-23 MED ORDER — IOHEXOL 350 MG/ML SOLN
75.0000 mL | Freq: Once | INTRAVENOUS | Status: AC | PRN
  Administered 2022-10-23: 75 mL via INTRAVENOUS

## 2022-10-23 MED ORDER — CLOPIDOGREL BISULFATE 75 MG PO TABS
75.0000 mg | ORAL_TABLET | Freq: Every day | ORAL | Status: DC
  Administered 2022-10-24: 75 mg via ORAL
  Filled 2022-10-23: qty 1

## 2022-10-23 MED ORDER — STROKE: EARLY STAGES OF RECOVERY BOOK: Freq: Once | Status: DC

## 2022-10-23 MED ORDER — PANTOPRAZOLE SODIUM 40 MG PO TBEC
40.0000 mg | DELAYED_RELEASE_TABLET | Freq: Every day | ORAL | Status: DC
  Administered 2022-10-24: 40 mg via ORAL
  Filled 2022-10-23: qty 1

## 2022-10-23 MED ORDER — ACETAMINOPHEN 325 MG PO TABS: 650.0000 mg | ORAL_TABLET | ORAL | Status: DC | PRN

## 2022-10-23 MED ORDER — ACETAMINOPHEN 325 MG RE SUPP: 650.0000 mg | RECTAL | Status: DC | PRN

## 2022-10-23 MED ORDER — HYDRALAZINE HCL 20 MG/ML IJ SOLN: 5.0000 mg | Freq: Four times a day (QID) | INTRAMUSCULAR | Status: DC | PRN

## 2022-10-23 MED ORDER — ENOXAPARIN SODIUM 40 MG/0.4ML IJ SOSY
40.0000 mg | PREFILLED_SYRINGE | INTRAMUSCULAR | Status: DC
  Administered 2022-10-23: 40 mg via SUBCUTANEOUS
  Filled 2022-10-23: qty 0.4

## 2022-10-23 MED ORDER — INSULIN ASPART 100 UNIT/ML IJ SOLN: 0.0000 [IU] | Freq: Three times a day (TID) | INTRAMUSCULAR | Status: DC

## 2022-10-23 MED ORDER — SENNOSIDES-DOCUSATE SODIUM 8.6-50 MG PO TABS: 1.0000 | ORAL_TABLET | Freq: Every evening | ORAL | Status: DC | PRN

## 2022-10-23 MED ORDER — SODIUM CHLORIDE 0.9% FLUSH
10.0000 mL | Freq: Two times a day (BID) | INTRAVENOUS | Status: DC
  Administered 2022-10-23 – 2022-10-24 (×2): 10 mL via INTRAVENOUS

## 2022-10-23 MED ORDER — CLOPIDOGREL BISULFATE 75 MG PO TABS
300.0000 mg | ORAL_TABLET | Freq: Once | ORAL | Status: AC
  Administered 2022-10-23: 300 mg via ORAL
  Filled 2022-10-23: qty 4

## 2022-10-23 MED ORDER — LORATADINE 10 MG PO TABS
10.0000 mg | ORAL_TABLET | Freq: Every day | ORAL | Status: DC
  Administered 2022-10-24: 10 mg via ORAL
  Filled 2022-10-23: qty 1

## 2022-10-23 MED ORDER — TAMSULOSIN HCL 0.4 MG PO CAPS
0.4000 mg | ORAL_CAPSULE | Freq: Every day | ORAL | Status: DC
  Administered 2022-10-24: 0.4 mg via ORAL
  Filled 2022-10-23: qty 1

## 2022-10-23 MED ORDER — ASPIRIN 325 MG PO TBEC
325.0000 mg | DELAYED_RELEASE_TABLET | Freq: Once | ORAL | Status: AC
  Administered 2022-10-23: 325 mg via ORAL
  Filled 2022-10-23: qty 1

## 2022-10-23 NOTE — ED Triage Notes (Signed)
Pt to ED via [facility] from home. 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 Upon EMS arrival, pt had returned to baseline with some mild confusion, back to baseline on arrival to hospital. Pt has hx TIA about a year ago, hx HTN and DM.  Cbg 125 BP 150/80 HR 90

## 2022-10-23 NOTE — ED Notes (Signed)
Pt ambulated to restroom and back to bed safely.

## 2022-10-23 NOTE — ED Provider Notes (Signed)
[facility] Provider Note    Event Date/Time   First MD Initiated Contact with Patient 10/23/22 1313     (approximate)   History   Weakness   HPI  Ronald Aguilar. is a 72 y.o. male with a history of diabetes, GERD hypertension hypercholesterol presents to the ER for evaluation of sudden onset slurred speech as well as word finding difficulty and left-sided subjective weakness that occurred around 11:00 while at work.  Symptoms lasted around 15 to 20 minutes.  He was able to walk to did not have any objective motor weakness no facial droop.     Physical Exam   Triage Vital Signs: ED Triage Vitals  Encounter Vitals Group     BP 10/23/22 1317 (!) 144/78     Systolic BP Percentile --      Diastolic BP Percentile --      Pulse Rate 10/23/22 1317 83     Resp 10/23/22 1317 18     Temp 10/23/22 1317 98.1 F (36.7 C)     Temp Source 10/23/22 1317 Oral     SpO2 10/23/22 1317 96 %     Weight 10/23/22 1316 225 lb (102.1 kg)     Height 10/23/22 1316 5\' 11"  (1.803 m)     Head Circumference --      Peak Flow --      Pain Score 10/23/22 1316 0     Pain Loc --      Pain Education --      Exclude from Growth Chart --     Most recent vital signs: Vitals:   10/23/22 1317 10/23/22 1400  BP: (!) 144/78 136/86  Pulse: 83 67  Resp: 18 18  Temp: 98.1 F (36.7 C)   SpO2: 96% 97%     Constitutional: Alert  Eyes: Conjunctivae are normal.  Head: Atraumatic. Nose: No congestion/rhinnorhea. Mouth/Throat: Mucous membranes are moist.   Neck: Painless ROM.  Cardiovascular:   Good peripheral circulation. Respiratory: Normal respiratory effort.  No retractions.  Gastrointestinal: Soft and nontender.  Musculoskeletal:  no deformity Neurologic:  MAE spontaneously. No gross focal neurologic deficits are appreciated.  Skin:  Skin is warm, dry and intact. No rash noted. Psychiatric: Mood and affect are normal. Speech and behavior are normal.    ED Results /  Procedures / Treatments   Labs (all labs ordered are listed, but only abnormal results are displayed) Labs Reviewed  CBC - Abnormal; Notable for the following components:      Result Value   Hemoglobin 12.7 (*)    HCT 38.0 (*)    All other components within normal limits  DIFFERENTIAL - Abnormal; Notable for the following components:   Basophils Absolute 0.2 (*)    All other components within normal limits  COMPREHENSIVE METABOLIC PANEL - Abnormal; Notable for the following components:   Glucose, Bld 140 (*)    Calcium 8.6 (*)    All other components within normal limits  ETHANOL  PROTIME-INR  APTT  URINE DRUG SCREEN, QUALITATIVE ([facility] ONLY)     EKG  ED ECG REPORT I, Willy Eddy, the attending physician, personally viewed and interpreted this ECG.   Date: 10/23/2022  EKG Time: 13:16  Rate: 75  Rhythm: sinus  Axis: normal  Intervals: normal  ST&T Change:  no stemi, no depressions    RADIOLOGY Please see ED Course for my review and interpretation.  I personally reviewed all radiographic images ordered to evaluate for the above  acute complaints and reviewed radiology reports and findings.  These findings were personally discussed with the patient.  Please see medical record for radiology report.    PROCEDURES:  Critical Care performed: No  Procedures   MEDICATIONS ORDERED IN ED: Medications - No data to display   IMPRESSION / MDM / ASSESSMENT AND PLAN / ED COURSE  I reviewed the triage vital signs and the nursing notes.                              Differential diagnosis includes, but is not limited to, cva, tia, hypoglycemia, dehydration, electrolyte abnormality, dissection, sepsis  Patient presenting to the ER for evaluation of symptoms as described above.  Based on symptoms, risk factors and considered above differential, this presenting complaint could reflect a potentially life-threatening illness therefore the patient will be placed on  continuous pulse oximetry and telemetry for monitoring.  Laboratory evaluation will be sent to evaluate for the above complaints.      Clinical Course as of 10/23/22 1457  Mon Oct 23, 2022  1411 CT imaging on my review and interpretation without evidence of bleed or mass.  Discussed case in consultation with neurology who does recommend admission the hospital for TIA workup.  Patient and family agreeable plan.  Will consult hospitalist for admission. [PR]    Clinical Course User Index [PR] Willy Eddy, MD     FINAL CLINICAL IMPRESSION(S) / ED DIAGNOSES   Final diagnoses:  TIA (transient ischemic attack)     Rx / DC Orders   ED Discharge Orders     None        Note:  This document was prepared using Dragon voice recognition software and may include unintentional dictation errors.    Willy Eddy, MD 10/23/22 [phone]

## 2022-10-23 NOTE — H&P (Addendum)
History and Physical    Ronald Aguilar. WUJ:[id number] DOB: 1950-02-02 DOA: 10/23/2022  PCP: Kennis Carina, MD (Confirm with patient/family/NH records and if not entered, this has to be entered at [facility] point of entry) Patient coming from: Home  I have personally briefly reviewed patient's old medical records in [facility]  Chief Complaint: Speech problems  HPI: Ronald Wehbe. is a 72 y.o. male with medical history significant of TIA in 2023, HTN, IIDM, HLD, BPH, presented with speech problems.  This morning, patient was helping his son to moving some boxes.  Patient was complaining about feeling weak and mild left-sided headache, he took some rest and took one energy bar then symptoms went away.  Around 12:00, son noticed that the patient started mumbling.  To clarify this and started to ask patient questions however patient appeared to answer noncoherent content.  Despite, sent remembered her that the patient complained about left leg feeling weak.  Son remembered that patient had a similar episode of speech problem back in December 2023 when he was diagnosed with mini stroke.  Son immediately EMS and EMS staff asked his son to perform some of the neurological screening test, which showed patient has symmetrical muscle strength and sensations over his legs and arms bilaterally.  However patient constantly failed repeating sentences.  And continue to talk gibberish.  This lasted about 20 to 30 minutes resolved en route to hospital.  Patient remember that he did not have any trouble understanding his son's talking but  "struggling to form sentences my mind" Last year patient had a similar episode, and came to ED CT head was negative and patient sent to cardiology for ambulatory monitoring which showed no significant A-fib. ED Course: Afebrile, blood pressure 140/78, O2 saturation 97% on room air.  CT head negative for acute findings but generalized cerebral atrophy with chronic white matter  small vessel ischemic changes.  Review of Systems: As per HPI otherwise 14 point review of systems negative.    Past Medical History:  Diagnosis Date   BPH (benign prostatic hyperplasia)    Chronic pain of right knee    DM II (diabetes mellitus, type II), controlled (HCC)    Esophageal stricture    Family history of colon cancer    sister (in her 75s)   GERD (gastroesophageal reflux disease)    Hypercholesteremia    Hypertension    Wears hearing aid in both ears     Past Surgical History:  Procedure Laterality Date   CERVICAL FUSION     c5-c6   COLONOSCOPY W/ BIOPSIES AND POLYPECTOMY     ESOPHAGOGASTRODUODENOSCOPY (EGD) WITH ESOPHAGEAL DILATION     MYRINGOTOMY WITH TUBE PLACEMENT Left 10/21/2021   Procedure: MYRINGOTOMY WITH TUBE PLACEMENT WITH BUTTERFLY TUBE;  Surgeon: Linus Salmons, MD;  Location: [facility];  Service: ENT;  Laterality: Left;  Diabetic   REPLACEMENT TOTAL KNEE Right    REPLACEMENT TOTAL KNEE Right      reports that he quit smoking about 53 years ago. His smoking use included cigarettes. He has never used smokeless tobacco. He reports current alcohol use of about 2.0 standard drinks of alcohol per week. He reports that he does not use drugs.  No Known Allergies  Family History  Problem Relation Age of Onset   Diabetes Mother    Heart disease Father      Prior to Admission medications   Medication Sig Start Date End Date Taking? Authorizing Provider  amLODipine (  NORVASC) 10 MG tablet Take 1 tablet (10 mg total) by mouth daily. 01/19/22   End, Cristal Deer, MD  Ascorbic Acid (VITAMIN C) 1000 MG tablet Take 1,000 mg by mouth daily.    [provider]  ASPIRIN 81 PO Take by mouth daily.    [provider]  atorvastatin (LIPITOR) 40 MG tablet Take 1 tablet (40 mg total) by mouth daily. 05/08/22   End, Cristal Deer, MD  cetirizine (ZYRTEC) 10 MG tablet Take 10 mg by mouth daily as needed for allergies.    [provider]   Cyanocobalamin (VITAMIN B12 PO) Take 1 tablet by mouth daily.    [provider]  finasteride (PROSCAR) 5 MG tablet Take 5 mg by mouth daily.    [provider]  fluticasone (FLONASE) 50 MCG/ACT nasal spray Place 2 sprays into both nostrils daily.    [provider]  lisinopril (ZESTRIL) 40 MG tablet Take 40 mg by mouth daily.    [provider]  metFORMIN (GLUCOPHAGE) 500 MG tablet Take 1,000 mg by mouth 2 (two) times daily with a meal.    [provider]  pantoprazole (PROTONIX) 40 MG tablet Take 40 mg by mouth daily.    [provider]  tamsulosin (FLOMAX) 0.4 MG CAPS capsule Take 0.4 mg by mouth daily.    [provider]    Physical Exam: Vitals:   10/23/22 1316 10/23/22 1317 10/23/22 1400  BP:  (!) 144/78 136/86  Pulse:  83 67  Resp:  18 18  Temp:  98.1 F (36.7 C)   TempSrc:  Oral   SpO2:  96% 97%  Weight: 102.1 kg 94.8 kg   Height: 5\' 11"  (1.803 m)      Constitutional: NAD, calm, comfortable Vitals:   10/23/22 1316 10/23/22 1317 10/23/22 1400  BP:  (!) 144/78 136/86  Pulse:  83 67  Resp:  18 18  Temp:  98.1 F (36.7 C)   TempSrc:  Oral   SpO2:  96% 97%  Weight: 102.1 kg 94.8 kg   Height: 5\' 11"  (1.803 m)     Eyes: PERRL, lids and conjunctivae normal ENMT: Mucous membranes are moist. Posterior pharynx clear of any exudate or lesions.Normal dentition.  Neck: normal, supple, no masses, no thyromegaly Respiratory: clear to auscultation bilaterally, no wheezing, no crackles. Normal respiratory effort. No accessory muscle use.  Cardiovascular: Regular rate and rhythm, no murmurs / rubs / gallops. No extremity edema. 2+ pedal pulses. No carotid bruits.  Abdomen: no tenderness, no masses palpated. No hepatosplenomegaly. Bowel sounds positive.  Musculoskeletal: no clubbing / cyanosis. No joint deformity upper and lower extremities. Good ROM, no contractures. Normal muscle tone.  Skin: no rashes, lesions, ulcers.  No induration Neurologic: CN 2-12 grossly intact. Sensation intact, DTR normal. Strength 5/5 in all 4.  Psychiatric: Normal judgment and insight. Alert and oriented x 3. Normal mood.    Labs on Admission: I have personally reviewed following labs and imaging studies  CBC: Recent Labs  Lab 10/23/22 1323  WBC 8.3  NEUTROABS 6.0  HGB 12.7*  HCT 38.0*  MCV 87.4  PLT 246   Basic Metabolic Panel: Recent Labs  Lab 10/23/22 1323  NA 136  K 3.7  CL 105  CO2 22  GLUCOSE 140*  BUN 20  CREATININE 0.88  CALCIUM 8.6*   GFR: Estimated Creatinine Clearance: 86.6 mL/min (by C-G formula based on SCr of 0.88 mg/dL). Liver Function Tests: Recent Labs  Lab 10/23/22 1323  AST 27  ALT 24  ALKPHOS 41  BILITOT 1.1  PROT 6.8  ALBUMIN 3.9   No results for input(s): "LIPASE", "AMYLASE" in the last 168 hours. No results for input(s): "AMMONIA" in the last 168 hours. Coagulation Profile: Recent Labs  Lab 10/23/22 1323  INR 1.1   Cardiac Enzymes: No results for input(s): "CKTOTAL", "CKMB", "CKMBINDEX", "TROPONINI" in the last 168 hours. BNP (last 3 results) No results for input(s): "PROBNP" in the last 8760 hours. HbA1C: No results for input(s): "HGBA1C" in the last 72 hours. CBG: No results for input(s): "GLUCAP" in the last 168 hours. Lipid Profile: No results for input(s): "CHOL", "HDL", "LDLCALC", "TRIG", "CHOLHDL", "LDLDIRECT" in the last 72 hours. Thyroid Function Tests: No results for input(s): "TSH", "T4TOTAL", "FREET4", "T3FREE", "THYROIDAB" in the last 72 hours. Anemia Panel: No results for input(s): "VITAMINB12", "FOLATE", "FERRITIN", "TIBC", "IRON", "RETICCTPCT" in the last 72 hours. Urine analysis: No results found for: "COLORURINE", "APPEARANCEUR", "LABSPEC", "PHURINE", "GLUCOSEU", "HGBUR", "BILIRUBINUR", "KETONESUR", "PROTEINUR", "UROBILINOGEN", "NITRITE", "LEUKOCYTESUR"  Radiological Exams on Admission: No results found.  EKG: Independently reviewed.  Sinus,  chronic RBBB, no acute ST changes.  Assessment/Plan Principal Problem:   TIA (transient ischemic attack)  (please populate well all problems here in Problem List. (For example, if patient is on BP meds at home and you resume or decide to hold them, it is a problem that needs to be her. Same for CAD, COPD, HLD and so on)  Acute expressive aphasia TIA, ABCD2=4, with a 7-day stroke risk 5.9% and 90-day stroke risk 9.8% -Dual antiplatelet therapy aspirin Plavix -Brain MRI without contrast and CTA head and neck -Echo was done on last TIA workup within one year, neurology recommend no need to repeat Echo on this admission. -Allow permissive compression -Risk factor modification, check A1c and lipid panel -Continue statin -PT, OT, speech consutl  IIDM -Hold off metformin as patient is to receive IV contrast today -Hold metformin for 48 hours. -SSI  HTN -Hold off home BP meds -Start as needed hydralazine  BPH -Continue Flomax  DVT prophylaxis: Lovenox Code Status: Full code Family Communication: Son at bedside Disposition Plan: Expect less than 2 midnight hospital stay Consults called: Neurology Admission status: Tele obs   Emeline General MD [facility] Pager [phone]  10/23/2022, 4:42 PM

## 2022-10-23 NOTE — ED Notes (Signed)
Assumed care of pt at this time. Pt is AAXO4, on CCM, VS stable and WNL. Call light within reach, side rails up x2. No needs identified at this time. Pt accompanied by family member

## 2022-10-24 ENCOUNTER — Encounter: Payer: Self-pay | Admitting: Internal Medicine

## 2022-10-24 DIAGNOSIS — G459 Transient cerebral ischemic attack, unspecified: Secondary | ICD-10-CM | POA: Diagnosis not present

## 2022-10-24 LAB — LIPID PANEL
Cholesterol: 90 mg/dL (ref 0–200)
HDL: 37 mg/dL — ABNORMAL LOW (ref >40)
LDL Cholesterol: 24 mg/dL (ref 0–99)
Total CHOL/HDL Ratio: 2.4 {ratio}
Triglycerides: 145 mg/dL (ref <150)
VLDL: 29 mg/dL (ref 0–40)

## 2022-10-24 LAB — CBG MONITORING, ED: Glucose-Capillary: 109 mg/dL — ABNORMAL HIGH (ref 70–99)

## 2022-10-24 MED ORDER — CLOPIDOGREL BISULFATE 75 MG PO TABS: 75.0000 mg | ORAL_TABLET | Freq: Every day | ORAL | 0 refills | Status: DC

## 2022-10-24 NOTE — Discharge Summary (Signed)
Physician Discharge Summary   Patient: Ronald Aguilar. MRN: [id number]  DOB: [birth year]   Admit:     Date of Admission: 10/23/2022 Admitted from: home   Discharge: Date of discharge: 10/24/22 Disposition: Home Condition at discharge: good  CODE STATUS: FULL CODE     Discharge Physician: Sunnie Nielsen, DO [facility]     PCP: Kennis Carina, MD  Recommendations for Outpatient Follow-up:  Follow up with PCP Kennis Carina, MD in 1-2 weeks Follow up with neurology 1-2 weeks Please obtain labs/tests: none needed Please follow up on the following pending results: none PCP AND OTHER OUTPATIENT PROVIDERS: SEE BELOW FOR SPECIFIC DISCHARGE INSTRUCTIONS PRINTED FOR PATIENT IN ADDITION TO GENERIC AVS PATIENT INFO     Discharge Instructions     Ambulatory referral to Neurology   Complete by: As directed    An appointment is requested in approximately: 4 weeks   Diet - low sodium heart healthy   Complete by: As directed    Diet Carb Modified   Complete by: As directed    Discharge instructions   Complete by: As directed    Adding Plavix aka clopidogrel to your medications Continue Aspirin for 3 weeks then stop this medication and stay on the Plavix   Increase activity slowly   Complete by: As directed          Discharge Diagnoses: Principal Problem:   TIA (transient ischemic attack)       Hospital Course:  Ronald Aguilar. is a 72 y.o. male with medical history significant of TIA in 2023, HTN, IIDM, HLD, BPH, presented with speech problems - patient was helping his son to moving some boxes.  Patient was complaining about feeling weak and mild left-sided headache, he took some rest and took one energy bar then symptoms went away.  Around 12:00, son noticed that the patient started mumbling.  To clarify this and started to ask patient questions however patient appeared to answer noncoherent content.  Despite, sent remembered her that the patient  complained about left leg feeling weak.  Son remembered that patient had a similar episode of speech problem back in December 2023 when he was diagnosed with mini stroke.  Son immediately EMS and EMS staff asked his son to perform some of the neurological screening test, which showed patient has symmetrical muscle strength and sensations over his legs and arms bilaterally.  However patient constantly failed repeating sentences.  And continue to talk gibberish.  This lasted about 20 to 30 minutes resolved en route to hospital.  Patient remember that he did not have any trouble understanding his son's talking but  "struggling to form sentences my mind"   Similar to previous TIA last year, resolved spontaneously. Admitted for observation, MRI brain no CVA. Per neurology, no need for repeat full workup since done last year for similar TIA. He has been on ASA outpatient, will add Plavix and advised DAPT x21d then plavix alone pending neurology follow-up      Assessment/plan:   Acute expressive aphasia due to TIA TIA, ABCD2=4, with a 7-day stroke risk 5.9% and 90-day stroke risk 9.8% -Dual antiplatelet therapy aspirin Plavix -Echo was done on last TIA workup within one year, neurology  recommend no need to repeat Echo on this admission. -Risk factor modification -Continue statin   IIDM -resume home medications   HTN -Resume home meds   BPH -Continue Flomax       Discharge Instructions  Allergies  as of 10/24/2022   No Known Allergies      Medication List     TAKE these medications    amLODipine 10 MG tablet Commonly known as: NORVASC Take 1 tablet (10 mg total) by mouth daily.   ASPIRIN 81 PO Take 81 mg by mouth daily.   atorvastatin 40 MG tablet Commonly known as: LIPITOR Take 1 tablet (40 mg total) by mouth daily.   cetirizine 10 MG tablet Commonly known as: ZYRTEC Take 10 mg by mouth daily as needed for allergies.   clopidogrel 75 MG tablet Commonly known as:  PLAVIX Take 1 tablet (75 mg total) by mouth daily. Start taking on: October 25, 2022   finasteride 5 MG tablet Commonly known as: PROSCAR Take 5 mg by mouth daily.   Flomax 0.4 MG Caps capsule Generic drug: tamsulosin Take 0.4 mg by mouth daily.   fluticasone 50 MCG/ACT nasal spray Commonly known as: FLONASE Place 2 sprays into both nostrils daily.   lisinopril 40 MG tablet Commonly known as: ZESTRIL Take 40 mg by mouth daily.   metFORMIN 500 MG 24 hr tablet Commonly known as: GLUCOPHAGE-XR Take 1,000 mg by mouth 2 (two) times daily.   pantoprazole 40 MG tablet Commonly known as: PROTONIX Take 40 mg by mouth daily.   VITAMIN B12 PO Take 1 tablet by mouth daily.   vitamin C 1000 MG tablet Take 1,000 mg by mouth daily.         Follow-up Information     Kennis Carina, MD. Schedule an appointment as soon as possible for a visit.   Specialty: Family Medicine Why: hospital follow up in 1-2 weeks ensure neurology follow up / referral Contact information: [address] [place] [address] [phone]         Morene Crocker, MD. Schedule an appointment as soon as possible for a visit.   Specialty: Neurology Why: hosptual follow up with nerology - Dr Malvin Johns or anyone at this office Contact information: [address] [facility] [place] [address] [phone]                 No Known Allergies   Subjective: pt reports feeling well this morning, no additional weakness or speech changes, inquires about discharge home    Discharge Exam: BP (!) 156/89 (BP Location: Left Arm)   Pulse 61   Temp (!) 97.5 F (36.4 C) (Axillary)   Resp 17   Ht 5\' 11"  (1.803 m)   Wt 94.8 kg   SpO2 94%   BMI 29.14 kg/m  General: Pt is alert, awake, not in acute distress Cardiovascular: RRR, S1/S2 +, no rubs, no gallops Respiratory: CTA bilaterally, no wheezing, no rhonchi Abdominal: Soft, NT, ND, bowel sounds  + Extremities: no edema, no cyanosis Neurology: normal speech and coordination, symmetrical strength all extremities, no CN deficit on gross exam      The results of significant diagnostics from this hospitalization (including imaging, microbiology, ancillary and laboratory) are listed below for reference.     Microbiology: No results found for this or any previous visit (from the past 240 hour(s)).   Labs: BNP (last 3 results) No results for input(s): "BNP" in the last 8760 hours. Basic Metabolic Panel: Recent Labs  Lab 10/23/22 1323  NA 136  K 3.7  CL 105  CO2 22  GLUCOSE 140*  BUN 20  CREATININE 0.88  CALCIUM 8.6*   Liver Function Tests: Recent Labs  Lab 10/23/22  1323  AST 27  ALT 24  ALKPHOS 41  BILITOT 1.1  PROT 6.8  ALBUMIN 3.9   No results for input(s): "LIPASE", "AMYLASE" in the last 168 hours. No results for input(s): "AMMONIA" in the last 168 hours. CBC: Recent Labs  Lab 10/23/22 1323  WBC 8.3  NEUTROABS 6.0  HGB 12.7*  HCT 38.0*  MCV 87.4  PLT 246   Cardiac Enzymes: No results for input(s): "CKTOTAL", "CKMB", "CKMBINDEX", "TROPONINI" in the last 168 hours. BNP: Invalid input(s): "POCBNP" CBG: Recent Labs  Lab 10/23/22 1711 10/23/22 2151 10/24/22 0807  GLUCAP 98 167* 109*   D-Dimer No results for input(s): "DDIMER" in the last 72 hours. Hgb A1c Recent Labs    10/23/22 1635  HGBA1C 6.5*   Lipid Profile Recent Labs    10/24/22 0512  CHOL 90  HDL 37*  LDLCALC 24  TRIG 960  CHOLHDL 2.4   Thyroid function studies No results for input(s): "TSH", "T4TOTAL", "T3FREE", "THYROIDAB" in the last 72 hours.  Invalid input(s): "FREET3" Anemia work up No results for input(s): "VITAMINB12", "FOLATE", "FERRITIN", "TIBC", "IRON", "RETICCTPCT" in the last 72 hours. Urinalysis No results found for: "COLORURINE", "APPEARANCEUR", "LABSPEC", "PHURINE", "GLUCOSEU", "HGBUR", "BILIRUBINUR", "KETONESUR", "PROTEINUR", "UROBILINOGEN", "NITRITE",  "LEUKOCYTESUR" Sepsis Labs Recent Labs  Lab 10/23/22 1323  WBC 8.3   Microbiology No results found for this or any previous visit (from the past 240 hour(s)). Imaging MR BRAIN WO CONTRAST  Result Date: 10/23/2022 CLINICAL DATA:  Stroke follow-up EXAM: MRI HEAD WITHOUT CONTRAST TECHNIQUE: Multiplanar, multiecho pulse sequences of the brain and surrounding structures were obtained without intravenous contrast. COMPARISON:  None Available. FINDINGS: Brain: No acute infarct, mass effect or extra-axial collection. No acute or chronic hemorrhage. There is multifocal hyperintense T2-weighted signal within the white matter. Generalized volume loss. The midline structures are normal. Vascular: Normal flow voids. Skull and upper cervical spine: Normal calvarium and skull base. Visualized upper cervical spine and soft tissues are normal. Sinuses/Orbits:Left maxillary sinus retention cyst. Right mastoid effusion. Ocular lens replacements. IMPRESSION: 1. No acute intracranial abnormality. 2. Findings of chronic small vessel ischemia and generalized volume loss. Electronically Signed   By: Deatra Robinson M.D.   On: 10/23/2022 20:48   CT ANGIO HEAD NECK W WO CM  Result Date: 10/23/2022 CLINICAL DATA:  Transient ischemic attack. Sudden onset headache and left leg weakness with inability to form sentences. EXAM: CT ANGIOGRAPHY HEAD AND NECK WITH AND WITHOUT CONTRAST TECHNIQUE: Multidetector CT imaging of the head and neck was performed using the standard protocol during bolus administration of intravenous contrast. Multiplanar CT image reconstructions and MIPs were obtained to evaluate the vascular anatomy. Carotid stenosis measurements (when applicable) are obtained utilizing NASCET criteria, using the distal internal carotid diameter as the denominator. RADIATION DOSE REDUCTION: This exam was performed according to the departmental dose-optimization program which includes automated exposure control, adjustment of  the mA and/or kV according to patient size and/or use of iterative reconstruction technique. CONTRAST:  75mL OMNIPAQUE IOHEXOL 350 MG/ML SOLN COMPARISON:  Head CT 10/23/2022. FINDINGS: CTA NECK FINDINGS Aortic arch: Three-vessel arch configuration. Arch vessel origins are patent. Right carotid system: No evidence of dissection, stenosis (50% or greater), or occlusion. Mild calcified plaque along the right carotid bulb. Left carotid system: No evidence of dissection, stenosis (50% or greater), or occlusion. Mild calcified plaque along the left carotid bulb. Vertebral arteries: Codominant. No evidence of dissection, stenosis (50% or greater), or occlusion. Skeleton: Unremarkable. Other neck: Unremarkable. Upper chest: Unremarkable. Review of the  MIP images confirms the above findings CTA HEAD FINDINGS Anterior circulation: Calcified plaque along the carotid siphons without hemodynamically significant stenosis. 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 Moderate stenosis of the right MCA M1 segment. Distal branches are symmetric. Posterior circulation: Normal basilar artery. The SCAs, AICAs and PICAs are patent proximally. Severe stenosis of the right PCA mid P2 segment (axial image 22 series 9). Multifocal luminal irregularity of the proximal left PCA without high-grade stenosis. Distal branches are symmetric. Venous sinuses: As permitted by contrast timing, patent. Anatomic variants: None. Review of the MIP images confirms the above findings IMPRESSION: 1. No large vessel occlusion. 2. 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 3. Moderate stenosis of the right MCA M1 segment. 4. Severe stenosis of the right PCA mid P2 segment. 5. No hemodynamically significant stenosis in the neck. Electronically Signed   By: Orvan Falconer M.D.   On: 10/23/2022 17:55   CT HEAD WO CONTRAST  Result Date: 10/23/2022 CLINICAL DATA:  Headache. EXAM: CT  HEAD WITHOUT CONTRAST TECHNIQUE: Contiguous axial images were obtained from the base of the skull through the vertex without intravenous contrast. RADIATION DOSE REDUCTION: This exam was performed according to the departmental dose-optimization program which includes automated exposure control, adjustment of the mA and/or kV according to patient size and/or use of iterative reconstruction technique. COMPARISON:  December 12, 2021 FINDINGS: Brain: There is mild cerebral atrophy with widening of the extra-axial spaces and ventricular dilatation. There are areas of decreased attenuation within the white matter tracts of the supratentorial brain, consistent with microvascular disease changes. Vascular: No hyperdense vessel or unexpected calcification. Skull: Normal. Negative for fracture or focal lesion. Sinuses/Orbits: 3.1 cm x 2.7 cm and 2.6 cm x 2.4 cm left maxillary sinus polyps versus mucous retention cysts are seen. Other: None. IMPRESSION: 1. Generalized cerebral atrophy with chronic white matter small vessel ischemic changes. 2. No acute intracranial abnormality. 3. Left maxillary sinus polyps versus mucous retention cysts. Electronically Signed   By: Aram Candela M.D.   On: 10/23/2022 16:43      Time coordinating discharge: over 30 minutes  SIGNED:  Sunnie Nielsen DO [facility]

## 2022-10-24 NOTE — Progress Notes (Signed)
SLP Cancellation Note  Patient Details Name: Ronald Aguilar. MRN: [id number] DOB: 17-Jul-1950   Cancelled treatment:       Reason Eval/Treat Not Completed: SLP screened, no needs identified, will sign off (chart reviewed; consulted NSG and met w/ pt in room.)   Pt denied any difficulty swallowing and is currently on a regular diet; tolerates swallowing pills w/ water per NSG. Pt sipping water via straw in room during visit.  Pt conversed in full, lengthy conversation w/out overt expressive/receptive deficits noted; pt denied any speech-language deficits currently stating resolution of s/s from yesterday. Speech clear, fully intelligible. Pt described much stress ongoing in his personal life currently; physical demands of house move also. No further skilled ST services indicated as pt appears at his communication baseline. Pt agreed. NSG to reconsult if any change in status while admitted.      Jerilynn Som, MS, CCC-SLP Speech Language Pathologist Rehab Services; [facility] [phone] (ascom) Sharisa Toves 10/24/2022, 8:19 AM

## 2022-10-25 NOTE — Telephone Encounter (Signed)
Left a message for the patient to call back.  

## 2023-01-27 ENCOUNTER — Encounter: Payer: Self-pay | Admitting: Internal Medicine

## 2023-04-08 ENCOUNTER — Other Ambulatory Visit: Payer: Self-pay | Admitting: Internal Medicine

## 2023-04-09 NOTE — Telephone Encounter (Signed)
 LVM to schedule fu, please schedule

## 2023-04-09 NOTE — Telephone Encounter (Signed)
 Good Morning,  Could you schedule this patient an overdue 6 month follow up visit? The patient was last seen by Dr. Okey Dupre on 08-25-22. Thank you so much.

## 2023-04-19 NOTE — Progress Notes (Signed)
 Cardiology Clinic Note   Date: 04/20/2023 ID: Ronald Aguilar., DOB May 01, 1950, MRN [id number]  Primary Cardiologist:  Sammy Crisp, MD  Chief Complaint   Ronald Aguilar. is a 73 y.o. male who presents to the clinic today for routine follow up.   Patient Profile   Ronald Aguilar. is followed by Dr. Nolan Battle for the history outlined below.      Past medical history significant for: RBBB. Hypertension. Hyperlipidemia. Lipid panel 10/24/2022: LDL 24, HDL 37, TG 145, total 90. Carotid artery stenosis. Carotid duplex 12/20/2021: Bilateral ICA 1 to 39%. TIA. Echo 12/20/2021: EF 55 to 60%.  No RWMA.  Normal diastolic parameters.  Normal RV size/function.  Trivial MR.  Moderate MAC.  Aortic valve sclerosis/calcification without stenosis.  Bubble study negative for interatrial shunt. 14-day ZIO 01/12/2022: HR 51 to 157 bpm, average 86 bpm.  Predominantly sinus rhythm.  Rare PACs/PVCs.  10 runs of SVT lasting up to 12 beats with a max rate of 160 bpm.  No sustained arrhythmia or prolonged pauses observed.  No patient triggered events. T2DM.  In summary, patient was first evaluated by Dr. Nolan Battle on 05/14/2021 for speech difficulty and presumed TIA.  Patient had been in his usual state of health until 2 days prior when he suddenly began to have difficulty getting words out during a stressful conversation with one of his sons.  He was unable to complete full sentences.  He was evaluated in the ED approximately 3 and half hours after symptom onset.  Head CT showed no acute abnormality.  Labs were unremarkable.  Troponin negative x 2.  His symptoms resolved and he left the ED without being seen.  At the time of his visit he was mostly back to normal with a residual headache that he attributed to allergies.  He noted occasional chest pain described as indigestion.  He underwent evaluation with carotid ultrasound, echo and 14-day ZIO which were all unrevealing as detailed above.  Patient was last seen in the  office by Dr. Nolan Battle on 08/25/2022 for routine follow-up.  He was doing well at that time and no changes were made.     History of Present Illness    Today, patient is here alone. He reports he is doing well. He recently had a loop recorder implanted at [facility] at the end of March. Patient denies shortness of breath, dyspnea on exertion, orthopnea or PND. He reports some lower extremity edema secondary to increased pain and swelling in knees L>R. He was seen by ortho and when swelling does down they will decide if he needs further diagnostic testing or injections. No chest pain, pressure, or tightness. No palpitations. He is doing yard work around his new home with no limitations.     ROS: All other systems reviewed and are otherwise negative except as noted in History of Present Illness.  EKGs/Labs Reviewed    EKG Interpretation Date/Time:  Friday April 20 2023 13:30:32 EDT Ventricular Rate:  81 PR Interval:  160 QRS Duration:  140 QT Interval:  392 QTC Calculation: 455 R Axis:   -18  Text Interpretation: Sinus rhythm with marked sinus arrhythmia Right bundle branch block When compared with ECG of 23-Oct-2022 13:16, No significant change Confirmed by Morey Ar [phone]) on 04/20/2023 1:32:25 PM   10/23/2022: ALT 24; AST 27; BUN 20; Creatinine, Ser 0.88; Potassium 3.7; Sodium 136   10/23/2022: Hemoglobin 12.7; WBC 8.3    Physical Exam    VS:  BP  132/78 (BP Location: Left Arm, Patient Position: Sitting, Cuff Size: Normal)   Pulse 81   Ht 5\' 11"  (1.803 m)   Wt 214 lb (97.1 kg)   SpO2 94%   BMI 29.85 kg/m  , BMI Body mass index is 29.85 kg/m.  GEN: Well nourished, well developed, in no acute distress. Neck: No JVD or carotid bruits. Cardiac:  RRR. No murmurs. No rubs or gallops.   Respiratory:  Respirations regular and unlabored. Clear to auscultation without rales, wheezing or rhonchi. GI: Soft, nontender, nondistended. Extremities: Radials/DP/PT 2+ and equal bilaterally. No  clubbing or cyanosis. No edema.  Skin: Warm and dry, no rash. Neuro: Strength intact.  Assessment & Plan   RBBB EKG today demonstrates sinus rhythm with RBBB.  Patient denies dyspnea, orthopnea, PND, chest pain or activity intolerance. He is performing moderate to heavy yard work around his home.  -No further workup indicated.   Hypertension BP today 142/80 on intake and 132/78 on recheck. No dizziness and headaches.  - Continue amlodipine , lisinopril.  Hyperlipidemia LDL 26 October 2022, at goal. - Continue atorvastatin .  History of TIA Patient reports loop recorder was implanted at [facility] at the end of March. He denies any speech difficulty recently.  - Continue aspirin , atorvastatin . -Continue to follow with neurology.  Disposition: Return in 1 year or sooner as needed.          Signed, Lonell Rives. Ceazia Harb, DNP, NP-C

## 2023-04-20 ENCOUNTER — Ambulatory Visit: Attending: Student | Admitting: Student

## 2023-04-20 ENCOUNTER — Encounter: Payer: Self-pay | Admitting: Student

## 2023-04-20 VITALS — BP 132/78 | HR 81 | Ht 71.0 in | Wt 214.0 lb

## 2023-04-20 DIAGNOSIS — E785 Hyperlipidemia, unspecified: Secondary | ICD-10-CM | POA: Diagnosis not present

## 2023-04-20 DIAGNOSIS — I10 Essential (primary) hypertension: Secondary | ICD-10-CM | POA: Diagnosis not present

## 2023-04-20 DIAGNOSIS — I4510 Unspecified right bundle-branch block: Secondary | ICD-10-CM | POA: Diagnosis not present

## 2023-04-20 DIAGNOSIS — Z8673 Personal history of transient ischemic attack (TIA), and cerebral infarction without residual deficits: Secondary | ICD-10-CM

## 2023-04-20 NOTE — Patient Instructions (Signed)
 Medication Instructions:  Your Physician recommend you continue on your current medication as directed.    *If you need a refill on your cardiac medications before your next appointment, please call your pharmacy*  Lab Work: None  If you have labs (blood work) drawn today and your tests are completely normal, you will receive your results only by: MyChart Message (if you have MyChart) OR A paper copy in the mail If you have any lab test that is abnormal or we need to change your treatment, we will call you to review the results.  Testing/Procedures: None   Follow-Up: At [facility], you and your health needs are our priority.  As part of our continuing mission to provide you with exceptional heart care, our providers are all part of one team.  This team includes your primary Cardiologist (physician) and Advanced Practice Providers or APPs (Physician Assistants and Nurse Practitioners) who all work together to provide you with the care you need, when you need it.  Your next appointment:   1 year(s)  Provider:   Lonni Hanson, MD or Barnie Hila, NP

## 2023-05-29 ENCOUNTER — Other Ambulatory Visit: Payer: Self-pay | Admitting: Orthopedic Surgery

## 2023-05-29 DIAGNOSIS — M1712 Unilateral primary osteoarthritis, left knee: Secondary | ICD-10-CM

## 2023-05-29 DIAGNOSIS — M2392 Unspecified internal derangement of left knee: Secondary | ICD-10-CM

## 2023-06-12 ENCOUNTER — Ambulatory Visit
Admission: RE | Admit: 2023-06-12 | Discharge: 2023-06-12 | Disposition: A | Discharge status: Home / Self Care | Source: Ambulatory Visit | Attending: Orthopedic Surgery | Admitting: Orthopedic Surgery

## 2023-06-12 DIAGNOSIS — M2392 Unspecified internal derangement of left knee: Secondary | ICD-10-CM

## 2023-06-12 DIAGNOSIS — M1712 Unilateral primary osteoarthritis, left knee: Secondary | ICD-10-CM

## 2024-01-31 ENCOUNTER — Telehealth: Payer: Self-pay | Admitting: Internal Medicine

## 2024-01-31 NOTE — Telephone Encounter (Signed)
 Pt is getting notifications about Afib from [facility]. Pt would no go into detail and wants a sooner appt to see Dr. Mady please advise.

## 2024-01-31 NOTE — Telephone Encounter (Signed)
 The last device interrogation I see is from 12/26/2023, at which time there was only a single episode of tachycardia, felt to be due to sinus tachycardia with oversensing, not a significant arrhythmia.  Please reach out to the [facility] device clinic and see if there has been anything else in the meantime that warrants attention.  Thanks.  Lonni Hanson, MD [facility]

## 2024-01-31 NOTE — Telephone Encounter (Signed)
 Called patient - he would like an in office with Dr, End, scheduled for 2/4 @ 9am

## 2024-02-01 NOTE — Telephone Encounter (Signed)
 Called both Heart clinic and device clinic at [facility] - no answer  [facility] [phone] [facility] [phone]

## 2024-02-06 ENCOUNTER — Ambulatory Visit: Admitting: Internal Medicine

## 2024-02-06 VITALS — BP 124/68 | HR 89 | Ht 71.0 in | Wt 215.0 lb

## 2024-02-06 DIAGNOSIS — Z8673 Personal history of transient ischemic attack (TIA), and cerebral infarction without residual deficits: Secondary | ICD-10-CM

## 2024-02-06 DIAGNOSIS — I10 Essential (primary) hypertension: Secondary | ICD-10-CM | POA: Diagnosis not present

## 2024-02-06 DIAGNOSIS — I480 Paroxysmal atrial fibrillation: Secondary | ICD-10-CM | POA: Diagnosis not present

## 2024-02-06 NOTE — Patient Instructions (Signed)
 Medication Instructions:  Your physician recommends that you continue on your current medications as directed. Please refer to the Current Medication list given to you today.    *If you need a refill on your cardiac medications before your next appointment, please call your pharmacy*  Lab Work: No labs ordered today    Testing/Procedures: No test ordered today   Follow-Up: At [facility], you and your health needs are our priority.  As part of our continuing mission to provide you with exceptional heart care, our providers are all part of one team.  This team includes your primary Cardiologist (physician) and Advanced Practice Providers or APPs (Physician Assistants and Nurse Practitioners) who all work together to provide you with the care you need, when you need it.  Your next appointment:   6 month(s)  Provider:   Lonni Hanson, MD    Your physician recommends that you schedule a follow-up appointment first available with Dr. Kennyth

## 2024-02-06 NOTE — Progress Notes (Unsigned)
" °  Cardiology Office Note:  .   Date:  02/06/2024  ID:  Ronald Aguilar., DOB [birth year], MRN [id number] PCP: Rudy Alyce RAMAN, MD  [facility] Providers Cardiologist:  Lonni Hanson, MD { Click to update primary MD,subspecialty MD or APP then REFRESH:1}    History of Present Illness: .   Ronald Aguilar. is a 74 y.o. male with history of TIA, paroxysmal atrial fibrillation, hypertension, hyperlipidemia, and type 2 diabetes mellitus, who presents for follow-up of atrial fibrillation.  I last saw him in 08/2022, when he was feeling fairly well.  Later that year, he had another TIA and was referred to [facility] for ILR implantation that subsequently revealed paroxysmal atrial fibrillation prompting initiation of apixaban.  With TIA, aware but couldn't speak.  Feels well.  No new sx.  No CP, SOB, palpitations.  Mild LE edema, maybe a little worse.  ROS: See HPI  Studies Reviewed: SABRA   EKG Interpretation Date/Time:  Wednesday February 06 2024 09:11:18 EST Ventricular Rate:  89 PR Interval:  170 QRS Duration:  140 QT Interval:  404 QTC Calculation: 491 R Axis:   -23  Text Interpretation: Sinus rhythm with occasional Premature ventricular complexes Right bundle branch block Abnormal ECG When compared with ECG of 20-Apr-2023 13:30, Premature ventricular complexes are now Present Confirmed by Laurey Salser, Lonni [phone]) on 02/06/2024 9:16:01 AM    *** Risk Assessment/Calculations:    CHA2DS2-VASc Score = 6  {Click here to calculate score.  REFRESH note before signing. :1} This indicates a 9.7% annual risk of stroke. The patient's score is based upon: CHF History: 0 HTN History: 1 Diabetes History: 1 Stroke History: 2 Vascular Disease History: 0 Age Score: 2 Gender Score: 0   {This patient has a significant risk of stroke if diagnosed with atrial fibrillation.  Please consider VKA or DOAC agent for anticoagulation if the bleeding risk is acceptable.   You can also use the SmartPhrase  [email or web address] for documentation.   :[id number]}         Physical Exam:   VS:  BP 124/68 (BP Location: Left Arm, Patient Position: Sitting, Cuff Size: Normal)   Pulse 89   Ht 5' 11 (1.803 m)   Wt 215 lb (97.5 kg)   SpO2 96%   BMI 29.99 kg/m    Wt Readings from Last 3 Encounters:  02/06/24 215 lb (97.5 kg)  04/20/23 214 lb (97.1 kg)  10/23/22 208 lb 14.4 oz (94.8 kg)    General:  NAD. Neck: No JVD or HJR. Lungs: Clear to auscultation bilaterally without wheezes or crackles. Heart: Regular rate and rhythm without murmurs, rubs, or gallops. Abdomen: Soft, nontender, nondistended. Extremities: Trace pretibial edema.  ASSESSMENT AND PLAN: .    ***    {Are you ordering a CV Procedure (e.g. stress test, cath, DCCV, TEE, etc)?   Press F2        :[id number]}  Dispo: ***  Signed, Lonni Hanson, MD  "

## 2024-02-07 ENCOUNTER — Encounter: Payer: Self-pay | Admitting: Internal Medicine

## 2024-02-07 DIAGNOSIS — I480 Paroxysmal atrial fibrillation: Secondary | ICD-10-CM | POA: Insufficient documentation

## 2024-05-20 ENCOUNTER — Ambulatory Visit: Admitting: Cardiology
